# Patient Record
Sex: Female | Born: 1985 | Race: White | Hispanic: No | Smoking: Former smoker
Health system: Southern US, Community
[De-identification: ages and names within clinical notes are randomized; demographics above are authoritative.]

## PROBLEM LIST (undated history)

## (undated) DIAGNOSIS — N644 Mastodynia: Secondary | ICD-10-CM

## (undated) DIAGNOSIS — F99 Mental disorder, not otherwise specified: Secondary | ICD-10-CM

## (undated) DIAGNOSIS — G709 Myoneural disorder, unspecified: Secondary | ICD-10-CM

## (undated) DIAGNOSIS — N6310 Unspecified lump in the right breast, unspecified quadrant: Secondary | ICD-10-CM

## (undated) DIAGNOSIS — L739 Follicular disorder, unspecified: Secondary | ICD-10-CM

## (undated) DIAGNOSIS — B029 Zoster without complications: Secondary | ICD-10-CM

## (undated) DIAGNOSIS — G8929 Other chronic pain: Secondary | ICD-10-CM

## (undated) DIAGNOSIS — B009 Herpesviral infection, unspecified: Secondary | ICD-10-CM

## (undated) DIAGNOSIS — M549 Dorsalgia, unspecified: Secondary | ICD-10-CM

## (undated) DIAGNOSIS — M1990 Unspecified osteoarthritis, unspecified site: Secondary | ICD-10-CM

## (undated) DIAGNOSIS — R102 Pelvic and perineal pain: Secondary | ICD-10-CM

## (undated) DIAGNOSIS — M797 Fibromyalgia: Secondary | ICD-10-CM

## (undated) DIAGNOSIS — F419 Anxiety disorder, unspecified: Secondary | ICD-10-CM

## (undated) HISTORY — DX: Fibromyalgia: M79.7

## (undated) HISTORY — DX: Mental disorder, not otherwise specified: F99

## (undated) HISTORY — DX: Unspecified lump in the right breast, unspecified quadrant: N63.10

## (undated) HISTORY — PX: APPENDECTOMY: SHX54

## (undated) HISTORY — DX: Mastodynia: N64.4

## (undated) HISTORY — PX: WISDOM TOOTH EXTRACTION: SHX21

## (undated) HISTORY — DX: Myoneural disorder, unspecified: G70.9

## (undated) HISTORY — DX: Herpesviral infection, unspecified: B00.9

## (undated) HISTORY — DX: Zoster without complications: B02.9

## (undated) HISTORY — DX: Follicular disorder, unspecified: L73.9

## (undated) HISTORY — DX: Unspecified osteoarthritis, unspecified site: M19.90

## (undated) HISTORY — DX: Anxiety disorder, unspecified: F41.9

---
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
## 2006-08-01 ENCOUNTER — Emergency Department (HOSPITAL_COMMUNITY): Admission: EM | Admit: 2006-08-01 | Discharge: 2006-08-02 | Payer: Self-pay | Admitting: Emergency Medicine

## 2007-02-20 ENCOUNTER — Emergency Department (HOSPITAL_COMMUNITY): Admission: EM | Admit: 2007-02-20 | Discharge: 2007-02-20 | Payer: Self-pay | Admitting: Emergency Medicine

## 2007-03-12 ENCOUNTER — Emergency Department (HOSPITAL_COMMUNITY): Admission: EM | Admit: 2007-03-12 | Discharge: 2007-03-12 | Payer: Self-pay | Admitting: *Deleted

## 2008-02-05 ENCOUNTER — Ambulatory Visit: Payer: Self-pay | Admitting: Gynecology

## 2008-02-05 ENCOUNTER — Observation Stay (HOSPITAL_COMMUNITY): Admission: AD | Admit: 2008-02-05 | Discharge: 2008-02-06 | Payer: Self-pay | Admitting: Obstetrics & Gynecology

## 2008-02-07 ENCOUNTER — Inpatient Hospital Stay (HOSPITAL_COMMUNITY): Admission: AD | Admit: 2008-02-07 | Discharge: 2008-02-07 | Payer: Self-pay | Admitting: Gynecology

## 2008-02-09 ENCOUNTER — Emergency Department (HOSPITAL_COMMUNITY): Admission: EM | Admit: 2008-02-09 | Discharge: 2008-02-10 | Payer: Self-pay | Admitting: Emergency Medicine

## 2008-02-09 ENCOUNTER — Inpatient Hospital Stay (HOSPITAL_COMMUNITY): Admission: AD | Admit: 2008-02-09 | Discharge: 2008-02-09 | Payer: Self-pay | Admitting: Gynecology

## 2010-05-26 ENCOUNTER — Other Ambulatory Visit: Admission: RE | Admit: 2010-05-26 | Discharge: 2010-05-26 | Payer: Self-pay | Admitting: Obstetrics & Gynecology

## 2010-07-03 ENCOUNTER — Ambulatory Visit: Payer: Self-pay | Admitting: Physician Assistant

## 2010-07-03 ENCOUNTER — Inpatient Hospital Stay (HOSPITAL_COMMUNITY): Admission: AD | Admit: 2010-07-03 | Discharge: 2010-07-04 | Payer: Self-pay | Admitting: Obstetrics & Gynecology

## 2010-09-22 ENCOUNTER — Encounter (HOSPITAL_COMMUNITY)
Admission: RE | Admit: 2010-09-22 | Discharge: 2010-10-22 | Payer: Self-pay | Source: Home / Self Care | Admitting: Obstetrics and Gynecology

## 2010-10-22 ENCOUNTER — Encounter (HOSPITAL_COMMUNITY)
Admission: RE | Admit: 2010-10-22 | Discharge: 2010-11-21 | Payer: Self-pay | Source: Home / Self Care | Attending: Obstetrics and Gynecology | Admitting: Obstetrics and Gynecology

## 2010-11-23 ENCOUNTER — Inpatient Hospital Stay (HOSPITAL_COMMUNITY)
Admission: AD | Admit: 2010-11-23 | Discharge: 2010-11-23 | Payer: Self-pay | Source: Home / Self Care | Attending: Obstetrics & Gynecology | Admitting: Obstetrics & Gynecology

## 2010-12-13 ENCOUNTER — Encounter: Payer: Self-pay | Admitting: Obstetrics and Gynecology

## 2010-12-13 ENCOUNTER — Encounter: Payer: Self-pay | Admitting: Gynecology

## 2011-01-05 ENCOUNTER — Inpatient Hospital Stay (HOSPITAL_COMMUNITY)
Admission: AD | Admit: 2011-01-05 | Discharge: 2011-01-10 | DRG: 765 | Disposition: A | Discharge status: Home / Self Care | Payer: Medicaid Other | Source: Ambulatory Visit | Attending: Obstetrics & Gynecology | Admitting: Obstetrics & Gynecology

## 2011-01-05 DIAGNOSIS — O139 Gestational [pregnancy-induced] hypertension without significant proteinuria, unspecified trimester: Secondary | ICD-10-CM | POA: Diagnosis present

## 2011-01-05 DIAGNOSIS — O480 Post-term pregnancy: Principal | ICD-10-CM | POA: Diagnosis present

## 2011-01-06 LAB — COMPREHENSIVE METABOLIC PANEL
AST: 25 U/L (ref 0–37)
Albumin: 3.2 g/dL — ABNORMAL LOW (ref 3.5–5.2)
CO2: 21 mEq/L (ref 19–32)
Calcium: 9.1 mg/dL (ref 8.4–10.5)
Creatinine, Ser: 0.59 mg/dL (ref 0.4–1.2)
GFR calc Af Amer: >60 mL/min (ref >60)
GFR calc non Af Amer: >60 mL/min (ref >60)

## 2011-01-06 LAB — CBC
MCH: 32.4 pg (ref 26.0–34.0)
MCHC: 34.1 g/dL (ref 30.0–36.0)
MCV: 94.4 fL (ref 78.0–100.0)
Platelets: 199 10*3/uL (ref 150–400)
Platelets: 216 10*3/uL (ref 150–400)
RDW: 13.7 % (ref 11.5–15.5)
WBC: 13.4 10*3/uL — ABNORMAL HIGH (ref 4.0–10.5)

## 2011-01-06 LAB — PROTEIN / CREATININE RATIO, URINE: Creatinine, Urine: 58 mg/dL

## 2011-01-06 LAB — RPR: RPR Ser Ql: NONREACTIVE

## 2011-01-07 DIAGNOSIS — O139 Gestational [pregnancy-induced] hypertension without significant proteinuria, unspecified trimester: Secondary | ICD-10-CM

## 2011-01-07 DIAGNOSIS — O480 Post-term pregnancy: Secondary | ICD-10-CM

## 2011-01-08 LAB — CBC
Hemoglobin: 10.1 g/dL — ABNORMAL LOW (ref 12.0–15.0)
MCHC: 33.2 g/dL (ref 30.0–36.0)
Platelets: 174 10*3/uL (ref 150–400)
RDW: 13.7 % (ref 11.5–15.5)

## 2011-01-11 LAB — RH IMMUNE GLOB WKUP(>/=20WKS)(NOT WOMEN'S HOSP)
Fetal Screen: NEGATIVE
Unit division: 0

## 2011-01-11 NOTE — Op Note (Signed)
Debbie [name], Debbie [name]            ACCOUNT NO.:  [id number]  MEDICAL RECORD NO.:  [id number]           PATIENT TYPE:  I  LOCATION:  9103                          FACILITY:  [facility]  PHYSICIAN:  Maryelizabeth Kaufmann, MD  DATE OF BIRTH:  [birth year]  DATE OF PROCEDURE:  01/07/2011 DATE OF DISCHARGE:                              OPERATIVE REPORT   PREOPERATIVE DIAGNOSES: 1. Intrauterine pregnancy at 41 weeks and 1 day. 2. Arrest of dilation. 3. Failed induction of labor.  POSTOPERATIVE DIAGNOSES: 1. Intrauterine pregnancy at 41 weeks and 1 day. 2. Arrest of dilation. 3. Failed induction of labor.  PROCEDURE:  Primary low transverse cesarean section via Pfannenstiel incision.  SURGEONS:  Catalina Antigua, MD and Maryelizabeth Kaufmann, MD.  ANESTHESIA:  Epidural.  INTRAVENOUS FLUIDS:  3 liters.  URINE OUTPUT:  200 mL of clear urine at the end of procedure.  ESTIMATED BLOOD LOSS:  800 mL.  FINDINGS:  A viable female infant in vertex presentation with clear amniotic fluid.  Normal uterus, tubes, and ovaries.  Apgars were 8 and 9, weight was 9 pounds 7 ounces.  SPECIMENS:  Placenta was sent to Labor and Delivery.  COMPLICATIONS:  None immediate.  INDICATIONS:  This is a 25 year old gravida 1, who presented with an intrauterine pregnancy at 41 weeks with gestational hypertension with blood pressures 140s-150s systolic and postdates.  Decision was made at that time to admit for induction of labor.  The patient received 2 doses Cytotec.  She also received a Foley bulb.  Subsequently after the Foley bulb was released, she progressed of 5 cm.  The patient was then started on Pitocin.  The patient made slow change and she remained at 6-7 cm for greater than 5 hours despite an IUPC and augmentation with Pitocin. Decision was made at that time to proceed with a cesarean section. Risks, benefits, and alternatives were explained to the patient, including but not limited to, the risk of bleeding,  infection, and damage to adjacent organs.  The patient was consented and proceeded with a primary low transverse cesarean section due to failure to progress and arrest of dilatation.  PROCEDURE NOTE IN DETAIL:  After the patient was taken back to the operative suite, epidural anesthesia was then redosed and found to be adequate.  The patient was placed in dorsal supine position with a leftward tilt.  The patient was prepped and draped in normal sterile fashion.  Again, after anesthesia was found to be adequate, a skin incision was then made via Pfannenstiel with a scalpel.  Then, incision was then carried down through to underlying fascia with the Bovie and the fascia was then incised in the midline.  The fascial incision was then extended laterally with the Mayo scissors.  The superior aspect of the fascial incision was then grasped with a Kocher clamps, elevated, tented up, and the rectus muscles were then dissected off bluntly. Attention was then turned to the inferior aspect which in similar fashion was grasped with the Kocher clamps, elevated, tented up, and the rectus muscles were then dissected off bluntly.  The rectus muscles were then separated in the midline.  The peritoneum  was then entered bluntly and Alexis 0 ring retractor was then inserted.  The vesicouterine peritoneum was then identified.  It was entered sharply with the Metzenbaum scissors and the bladder flap was then created digitally. The lower uterine segment was then incised in a transverse fashion with the scalpel.  The incision was then extended manually.  The infant was found to be in vertex presentation.  Infant was delivered otherwise atraumatically.  Mouth and nose were bulb suctioned.  Cord was cut and clamped.  It was noted that the patient did have a nuchal body cord and the cord was cut and clamped.  Infant was handed off to awaiting NICU. Apgars were 8/9, weight was 9 pounds and 7 ounces.  Cord blood  was sampled.  Placenta was then delivered spontaneously intact with 3-vessel cord.  The uterus was then cleared of all clots and debris.  The uterus did have some mild atony, which resolved with massage and Pitocin.  The uterine incision was then repaired with 0 Vicryl in a running locking fashion and then subsequent second layer was then imbricated using a 2- layer closure.  The uterine incision was then found to be hemostatic. However, she did have one area that required 1 figure-of-eight. Otherwise, the peritoneum was then irrigated clear of all clots and debris.  The Alexis 0 ring retractor was then removed.  The uterine incision was then visualized and found to be hemostatic.  The peritoneum was then cleared of all clots and debris and irrigated.  The fascia was then repaired with 0 Vicryl in a running fashion.  The subcutaneous tissue was then irrigated.  Additional hemostasis was obtained with a Bovie.  The skin was then closed with a 4-0 Vicryl in a subcuticular fashion.  The patient tolerated the procedure well.  The patient was taken back to the recovery area in stable condition.  Lap, needle, and sponge counts were correct x2.  The patient did receive antibiotics perioperatively.          ______________________________ Maryelizabeth Kaufmann, MD     LC/MEDQ  D:  01/07/2011  T:  01/08/2011  Job:  [id number]  Electronically Signed by Maryelizabeth Kaufmann MD on 01/11/2011 12:25:35 PM

## 2011-01-19 NOTE — Discharge Summary (Signed)
Debbie [name], Debbie [name]            ACCOUNT NO.:  [id number]  MEDICAL RECORD NO.:  [id number]           PATIENT TYPE:  I  LOCATION:  9103                          [facility]ILITY:  [facility]  PHYSICIAN:  Horton Chin, MD DATE OF BIRTH:  1986-07-21  DATE OF ADMISSION:  01/05/2011 DATE OF DISCHARGE:  01/10/2011                              DISCHARGE SUMMARY   ADMITTING DIAGNOSES: 1. Intrauterine pregnancy at 65 and 0/7th weeks' gestation. 2. Gestational hypertension. 3. Unfavorable cervix.  DISCHARGE DIAGNOSES: 1. Intrauterine pregnancy at 36 and 0/7th weeks' gestation. 2. Gestational hypertension. 3. Unfavorable cervix. 4. Failure to progress.  HOSPITAL COURSE:  Debbie [name] is a 25 year old, gravida 2, para 0-0-1-0 who was admitted at 57 and 0/7th weeks with contractions.  She was having some slightly elevated blood pressures, 149/86.  Her cervix was dilated initially to 1 cm and changed to 2 cm after observation.  She had PIH labs done which were within normal limits and she was admitted to Labor and Delivery for induction of labor due to the elevated blood pressures.  Her pregnancy has been followed by the [facility] and has been remarkable for: 1. Rh negative. 2. Back pain during pregnancy requiring Lortab. 3. Group B strep negative.  The patient reached 6-7 cm and 80% effaced after multiple methods of labor induction were used including Cytotec, Foley, and Pitocin. Cesarean section was discussed with the patient due to no cervical change over 5 hours with adequate labor.  The patient agreed with that plan.  She was taken to the operating room.  Infant was a viable female in vertex presentation, weight was 9 pounds and 7 ounces, Apgars were 8 and 9.  Infant was taken to the Full-Term Nursery in good condition.  The patient was taken to the recovery room and was doing well by postop day #1.  The patient was doing well.  Her hemoglobin was 10.1, hematocrit 30.4,  white blood cell count 16.5, and platelets 174,000.  Predelivery, her hemoglobin was 12.2, hemoglobin 35.8, white blood cell count 12.0, and platelets 199,000.  The patient was initially breastfeeding and then changed to bottle feeding.  She expressed a desire for oral contraceptives.  On postop day #2, she received some Lasix as well as started on HCTZ due to some severe pitting edema.  By postop day #3, she was doing well.  Vital signs were stable.  She was deemed to have received full benefit of her hospital stay and she was discharged home.  DISCHARGE MEDICATIONS: 1. Motrin 600 mg 1 p.o. q.6 h. p.r.n. pain. 2. Percocet 5/325 one to two p.o. q.4-6 h. p.r.n. pain. 3. Loestrin 1 p.o. daily to start on January 31, 2011. 4. HCTZ 25 mg 1 p.o. daily x7 days.  DISCHARGE INSTRUCTIONS:  Per postpartum handout.  Discharge followup will occur at [facility] in 4-6 weeks or as needed.     Cam Hai, C.N.M.   ______________________________ Horton Chin, MD    KS/MEDQ  D:  01/10/2011  T:  01/10/2011  Job:  [id number]  Electronically Signed by Cam Hai C.N.M. on 01/16/2011 08:33:19 AM Electronically Signed by  Jaynie Collins MD on 01/19/2011 06:33:34 PM

## 2011-02-01 LAB — COMPREHENSIVE METABOLIC PANEL
ALT: 14 U/L (ref 0–35)
Alkaline Phosphatase: 99 U/L (ref 39–117)
BUN: 7 mg/dL (ref 6–23)
Chloride: 106 mEq/L (ref 96–112)
Glucose, Bld: 88 mg/dL (ref 70–99)
Potassium: 3.7 mEq/L (ref 3.5–5.1)
Sodium: 134 mEq/L — ABNORMAL LOW (ref 135–145)
Total Bilirubin: 0.7 mg/dL (ref 0.3–1.2)

## 2011-02-01 LAB — URINALYSIS, ROUTINE W REFLEX MICROSCOPIC
Glucose, UA: NEGATIVE mg/dL
Hgb urine dipstick: NEGATIVE
Specific Gravity, Urine: >1.03 — ABNORMAL HIGH (ref 1.005–1.030)
pH: 6 (ref 5.0–8.0)

## 2011-02-01 LAB — CBC
HCT: 37.3 % (ref 36.0–46.0)
Hemoglobin: 12.7 g/dL (ref 12.0–15.0)
MCV: 94.7 fL (ref 78.0–100.0)
RBC: 3.94 MIL/uL (ref 3.87–5.11)
WBC: 15.4 10*3/uL — ABNORMAL HIGH (ref 4.0–10.5)

## 2011-02-05 LAB — URINALYSIS, ROUTINE W REFLEX MICROSCOPIC
Hgb urine dipstick: NEGATIVE
Ketones, ur: NEGATIVE mg/dL
Protein, ur: NEGATIVE mg/dL
Urobilinogen, UA: 0.2 mg/dL (ref 0.0–1.0)

## 2011-03-18 ENCOUNTER — Emergency Department (HOSPITAL_COMMUNITY): Payer: Medicaid Other

## 2011-03-18 ENCOUNTER — Emergency Department (HOSPITAL_COMMUNITY)
Admission: EM | Admit: 2011-03-18 | Discharge: 2011-03-19 | Disposition: A | Discharge status: Home / Self Care | Payer: Medicaid Other | Attending: Emergency Medicine | Admitting: Emergency Medicine

## 2011-03-18 DIAGNOSIS — Z79899 Other long term (current) drug therapy: Secondary | ICD-10-CM | POA: Insufficient documentation

## 2011-03-18 DIAGNOSIS — M549 Dorsalgia, unspecified: Secondary | ICD-10-CM | POA: Insufficient documentation

## 2011-03-18 DIAGNOSIS — G43909 Migraine, unspecified, not intractable, without status migrainosus: Secondary | ICD-10-CM | POA: Insufficient documentation

## 2011-03-18 DIAGNOSIS — F329 Major depressive disorder, single episode, unspecified: Secondary | ICD-10-CM | POA: Insufficient documentation

## 2011-03-18 DIAGNOSIS — F3289 Other specified depressive episodes: Secondary | ICD-10-CM | POA: Insufficient documentation

## 2011-04-06 NOTE — Discharge Summary (Signed)
Debbie [name], BOSS NO.:  [id number]   MEDICAL RECORD NO.:  [id number]          PATIENT TYPE:  OBV   LOCATION:  9309                          [facility]:  [facility]   PHYSICIAN:  Ginger Carne, MD  DATE OF BIRTH:  [birth year]   DATE OF ADMISSION:  02/05/2008  DATE OF DISCHARGE:  02/06/2008                               DISCHARGE SUMMARY   REASON FOR HOSPITALIZATION:  Positive serum pregnancy test with right  lower quadrant pain.   IN-HOSPITAL PROCEDURES:  Pelvic sonogram.   FINAL DIAGNOSIS:  Early pregnancy.   HISTORY OF PRESENT ILLNESS:  This patient is a 25 year old gravida 1,  para 0, Caucasian female who presented to the maternity admission unit  because of right lower quadrant pain and a positive pregnancy test.  The  patient was noted to have a quantitative HCG of 257 at 9 p.m. on February 05, 2008.  Her quantitative HCG on the morning of February 06, 2008, was  269.  During the course of the evening, the patient states that her  right lower quadrant pain has significantly diminished.  She denies  spotting.  Her CBC on the morning of the February 06, 2008, was 34.7 and  12.3 hematocrit and hemoglobin respectively.  White cell count was 8.0.   Review of sonogram with Dr. Eppie Gibson demonstrate no evidence of free fluid,  no evidence of failed intrauterine pregnancy or early gestational sac.  Her right and left tubes appeared normal as well as both adnexa.  There  was no adnexal enlargement on either side.  There was a questionable  corpus luteal cyst on the right side, but this was not confirmatory.   At this point, the patient feels well to go home, and the decision was  made that since it has only been less than 12 hour time difference  between her quantitative HCG obtained on the evening of her admission  and in the morning of her said discharge, the patient will be return to  the maternity admission unit on February 07, 2008, in order to have a  repeat quantitative  HCG and reassessment.  At this point, it is unclear  whether the patient has an early viable intrauterine pregnancy, a failed  intrauterine gestation, or an ectopic pregnancy.  The patient was  apprised of these findings and was asked to return to the maternity  admission unit immediately for increasing right lower quadrant pain,  dizziness, or abdominal pain, or vaginal bleeding.  She is comfortable  with this approach.  She was offered to stay in the hospital overnight  and have followup testing tomorrow but was in agreement that she could  be discharged safely and to return as needed.  The patient understood  all instructions and verbalized understanding the same.      Ginger Carne, MD  Electronically Signed     SHB/MEDQ  D:  02/06/2008  T:  02/06/2008  Job:  [id number]

## 2011-05-27 ENCOUNTER — Other Ambulatory Visit: Payer: Self-pay | Admitting: Adult Health

## 2011-05-27 ENCOUNTER — Other Ambulatory Visit (HOSPITAL_COMMUNITY)
Admission: RE | Admit: 2011-05-27 | Discharge: 2011-05-27 | Disposition: A | Discharge status: Home / Self Care | Payer: Medicaid Other | Source: Ambulatory Visit | Attending: Obstetrics and Gynecology | Admitting: Obstetrics and Gynecology

## 2011-05-27 DIAGNOSIS — Z113 Encounter for screening for infections with a predominantly sexual mode of transmission: Secondary | ICD-10-CM | POA: Insufficient documentation

## 2011-05-27 DIAGNOSIS — Z01419 Encounter for gynecological examination (general) (routine) without abnormal findings: Secondary | ICD-10-CM | POA: Insufficient documentation

## 2011-05-30 ENCOUNTER — Emergency Department (HOSPITAL_COMMUNITY)
Admission: EM | Admit: 2011-05-30 | Discharge: 2011-05-30 | Disposition: A | Discharge status: Home / Self Care | Payer: Medicaid Other | Attending: Emergency Medicine | Admitting: Emergency Medicine

## 2011-05-30 ENCOUNTER — Encounter: Payer: Self-pay | Admitting: Emergency Medicine

## 2011-05-30 DIAGNOSIS — K0889 Other specified disorders of teeth and supporting structures: Secondary | ICD-10-CM

## 2011-05-30 DIAGNOSIS — K089 Disorder of teeth and supporting structures, unspecified: Secondary | ICD-10-CM | POA: Insufficient documentation

## 2011-05-30 MED ORDER — OXYCODONE-ACETAMINOPHEN 5-325 MG PO TABS
1.0000 | ORAL_TABLET | Freq: Once | ORAL | Status: AC
  Administered 2011-05-30: 1 via ORAL
  Filled 2011-05-30: qty 1

## 2011-05-30 MED ORDER — OXYCODONE-ACETAMINOPHEN 5-325 MG PO TABS: 1.0000 | ORAL_TABLET | ORAL | Status: AC | PRN

## 2011-05-30 MED ORDER — PENICILLIN V POTASSIUM 500 MG PO TABS: 500.0000 mg | ORAL_TABLET | Freq: Four times a day (QID) | ORAL | Status: AC

## 2011-05-30 NOTE — ED Notes (Signed)
No change. Awaiting pa

## 2011-05-30 NOTE — ED Notes (Signed)
Pa with pt at this time.

## 2011-05-30 NOTE — ED Provider Notes (Signed)
History     Chief Complaint  Patient presents with  . Dental Pain   Patient is a 25 y.o. female presenting with tooth pain. The history is provided by the patient.  Dental PainThe primary symptoms include mouth pain. Primary symptoms do not include dental injury, oral bleeding, headaches, fever or sore throat. The symptoms began 3 to 5 days ago. The symptoms are worsening. The symptoms are new. The symptoms occur constantly.  Additional symptoms include: gum swelling and gum tenderness. Additional symptoms do not include: trismus, jaw pain, facial swelling, pain with swallowing and ear pain.    History reviewed. No pertinent past medical history.  Past Surgical History  Procedure Date  . Cesarean section     Family History  Problem Relation Age of Onset  . Diabetes Father   . Hypertension Father   . Hypertension Sister     History  Substance Use Topics  . Smoking status: Passive Smoker  . Smokeless tobacco: Not on file  . Alcohol Use: No    OB History    Grav Para Term Preterm Abortions TAB SAB Ect Mult Living   2 1 1  1   1  1       Review of Systems  Constitutional: Negative.  Negative for fever.  HENT: Positive for dental problem. Negative for ear pain, sore throat, facial swelling and sneezing.   Respiratory: Negative.   Cardiovascular: Negative.   Musculoskeletal: Negative.   Neurological: Negative for weakness and headaches.    Physical Exam  BP 144/70  Pulse 90  Temp(Src) 98.1 F (36.7 C) (Oral)  Resp 16  Ht 5\' 2"  (1.575 m)  Wt 187 lb (84.823 kg)  BMI 34.20 kg/m2  SpO2 100%  LMP 04/28/2011  Physical Exam  Constitutional: She is oriented to person, place, and time. She appears well-developed and well-nourished.  Non-toxic appearance.  HENT:  Head: Normocephalic.  Right Ear: External ear normal.  Left Ear: External ear normal.  Mouth/Throat: Uvula is midline. No dental abscesses or uvula swelling.         Right lower gum is slightly swollen and  erythematous  Eyes: EOM are normal. Pupils are equal, round, and reactive to light.  Neck: Normal range of motion. Neck supple.  Cardiovascular: Normal rate, regular rhythm and normal heart sounds.   Pulmonary/Chest: Effort normal and breath sounds normal.  Musculoskeletal: Normal range of motion.  Lymphadenopathy:    She has no cervical adenopathy.  Neurological: She is alert and oriented to person, place, and time.  Skin: Skin is warm and dry.    ED Course  Procedures  MDM  PAtient is alert, NAD.  No facial swelling, airway is clear      Debbie [name] L. [place], [place] 05/30/11 1656

## 2011-05-30 NOTE — ED Notes (Signed)
Pt c/o r lower toothache since wednesday

## 2011-06-17 ENCOUNTER — Other Ambulatory Visit: Payer: Self-pay | Admitting: Obstetrics and Gynecology

## 2011-08-12 ENCOUNTER — Emergency Department (HOSPITAL_COMMUNITY): Payer: Medicaid Other

## 2011-08-12 ENCOUNTER — Encounter (HOSPITAL_COMMUNITY): Payer: Self-pay | Admitting: *Deleted

## 2011-08-12 ENCOUNTER — Emergency Department (HOSPITAL_COMMUNITY)
Admission: EM | Admit: 2011-08-12 | Discharge: 2011-08-12 | Disposition: A | Discharge status: Home / Self Care | Payer: Medicaid Other | Attending: Emergency Medicine | Admitting: Emergency Medicine

## 2011-08-12 DIAGNOSIS — E119 Type 2 diabetes mellitus without complications: Secondary | ICD-10-CM | POA: Insufficient documentation

## 2011-08-12 DIAGNOSIS — S335XXA Sprain of ligaments of lumbar spine, initial encounter: Secondary | ICD-10-CM | POA: Insufficient documentation

## 2011-08-12 DIAGNOSIS — I10 Essential (primary) hypertension: Secondary | ICD-10-CM | POA: Insufficient documentation

## 2011-08-12 DIAGNOSIS — X500XXA Overexertion from strenuous movement or load, initial encounter: Secondary | ICD-10-CM | POA: Insufficient documentation

## 2011-08-12 DIAGNOSIS — M545 Low back pain, unspecified: Secondary | ICD-10-CM | POA: Insufficient documentation

## 2011-08-12 DIAGNOSIS — Y92009 Unspecified place in unspecified non-institutional (private) residence as the place of occurrence of the external cause: Secondary | ICD-10-CM | POA: Insufficient documentation

## 2011-08-12 HISTORY — DX: Dorsalgia, unspecified: M54.9

## 2011-08-12 LAB — POCT PREGNANCY, URINE: Preg Test, Ur: NEGATIVE

## 2011-08-12 MED ORDER — OXYCODONE-ACETAMINOPHEN 5-325 MG PO TABS: 1.0000 | ORAL_TABLET | ORAL | Status: AC | PRN

## 2011-08-12 MED ORDER — ONDANSETRON 8 MG PO TBDP
8.0000 mg | ORAL_TABLET | Freq: Once | ORAL | Status: AC
  Administered 2011-08-12: 8 mg via ORAL
  Filled 2011-08-12: qty 1

## 2011-08-12 MED ORDER — IBUPROFEN 800 MG PO TABS: 800.0000 mg | ORAL_TABLET | Freq: Three times a day (TID) | ORAL | Status: AC

## 2011-08-12 MED ORDER — HYDROMORPHONE HCL 1 MG/ML IJ SOLN
1.0000 mg | Freq: Once | INTRAMUSCULAR | Status: AC
  Administered 2011-08-12: 1 mg via INTRAMUSCULAR
  Filled 2011-08-12: qty 1

## 2011-08-12 NOTE — ED Notes (Signed)
Pt states bent down to pick up her son this morning and felt pain in lower back and fell backwards.  Now c/o pain to lower back, shooting down right leg.  Denies hitting head when falling.  Pt tearful in triage.

## 2011-08-12 NOTE — ED Provider Notes (Signed)
History     CSN: [id number] Arrival date & time: 08/12/2011  2:35 PM  Chief Complaint  Patient presents with  . Back Pain    HPI  (Consider location/radiation/quality/duration/timing/severity/associated sxs/prior treatment)  Patient is a 25 y.o. female presenting with back pain and fall.  Back Pain  Pertinent negatives include no chest pain, no fever, no numbness, no headaches, no abdominal pain, no tingling and no weakness.  Fall The accident occurred 6 to 12 hours ago. The fall occurred while standing (she stooped over to pick up her infant son,  when she stumbbled backward,  falling on her lower back and right hip area.). She landed on a hard floor. The point of impact was the right hip (lower back). The pain is at a severity of 10/10. The pain is severe. She was ambulatory at the scene. Pertinent negatives include no fever, no numbness, no abdominal pain, no nausea, no headaches and no tingling. Associated symptoms comments: Pain radiates to her right anterolateral lower thigh.. The symptoms are aggravated by activity (Movement). She has tried rest for the symptoms. The treatment provided no relief.    Past Medical History  Diagnosis Date  . Back pain     Past Surgical History  Procedure Date  . Cesarean section     Family History  Problem Relation Age of Onset  . Diabetes Father   . Hypertension Father   . Hypertension Sister     History  Substance Use Topics  . Smoking status: Passive Smoker  . Smokeless tobacco: Not on file  . Alcohol Use: No    OB History    Grav Para Term Preterm Abortions TAB SAB Ect Mult Living   2 1 1  1   1  1       Review of Systems  Review of Systems  Constitutional: Negative for fever.  HENT: Negative for congestion, sore throat and neck pain.   Eyes: Negative.   Respiratory: Negative for chest tightness and shortness of breath.   Cardiovascular: Negative for chest pain.  Gastrointestinal: Negative for nausea and abdominal pain.   Genitourinary: Negative.   Musculoskeletal: Positive for back pain. Negative for joint swelling and arthralgias.  Skin: Negative.  Negative for rash and wound.  Neurological: Negative for dizziness, tingling, weakness, light-headedness, numbness and headaches.  Hematological: Negative.   Psychiatric/Behavioral: Negative.     Allergies  Review of patient's allergies indicates no known allergies.  Home Medications   Current Outpatient Rx  Name Route Sig Dispense Refill  . ACETAMINOPHEN 500 MG PO TABS Oral Take 500-1,000 mg by mouth daily as needed. For pain     . IBUPROFEN 200 MG PO TABS Oral Take 200-600 mg by mouth daily as needed. For back pain     . LOESTRIN 24 FE PO Oral Take 1 tablet by mouth daily.      Marland Kitchen HYDROCODONE-ACETAMINOPHEN 5-325 MG PO TABS Oral Take 1 tablet by mouth every 6 (six) hours as needed.      . IBUPROFEN 800 MG PO TABS Oral Take 1 tablet (800 mg total) by mouth 3 (three) times daily. 15 tablet 0  . OXYCODONE-ACETAMINOPHEN 5-325 MG PO TABS Oral Take 1 tablet by mouth every 4 (four) hours as needed for pain. 20 tablet 0    Physical Exam    BP 134/93  Pulse 115  Temp(Src) 99.4 F (37.4 C) (Oral)  Resp 16  Ht 5\' 2"  (1.575 m)  Wt 190 lb (86.183 kg)  BMI 34.75 kg/m2  SpO2 100%  LMP 06/21/2011  Physical Exam  Constitutional: She is oriented to person, place, and time. She appears well-developed and well-nourished.  HENT:  Head: Normocephalic.  Eyes: Conjunctivae are normal.  Neck: Normal range of motion. Neck supple.  Cardiovascular: Regular rhythm and intact distal pulses.        Pedal pulses normal.  Pulmonary/Chest: Effort normal. She has no wheezes.  Abdominal: Soft. Bowel sounds are normal. She exhibits no distension and no mass.  Musculoskeletal: Normal range of motion. She exhibits no edema.       Lumbar back: She exhibits tenderness. She exhibits no swelling, no edema and no spasm.  Neurological: She is alert and oriented to person, place, and  time. She has normal strength. She displays no atrophy and no tremor. No cranial nerve deficit or sensory deficit. Gait normal.  Reflex Scores:      Patellar reflexes are 2+ on the right side and 2+ on the left side.      Achilles reflexes are 2+ on the right side and 2+ on the left side.      No strength deficit noted in hip and knee flexor and extensor muscle groups.  Ankle flexion and extension intact.  Skin: Skin is warm and dry.  Psychiatric: She has a normal mood and affect.    ED Course  Procedures (including critical care time)   Labs Reviewed  POCT PREGNANCY, URINE  POCT PREGNANCY, URINE   Dg Lumbar Spine Complete  08/12/2011  *RADIOLOGY REPORT*  Clinical Data: 25 year old female with fall and low back pain.  LUMBAR SPINE - COMPLETE 4+ VIEW  Comparison: 04/26 1012  Findings: Five non-rib bearing lumbar type vertebra are noted in normal alignment. There is no evidence of fracture or subluxation. The disc spaces are maintained. No focal bony lesions or spondylolysis noted.  IMPRESSION: Unremarkable lumbar spine series.  Original Report Authenticated By: Rosendo Gros, M.D.   Dg Hip Bilateral W/pelvis  08/12/2011  *RADIOLOGY REPORT*  Clinical Data: 25 year old female - fall and bilateral hip pain.  BILATERAL HIP WITH PELVIS - 4+ VIEW  Comparison: None  Findings: No evidence of acute fracture, subluxation or dislocation identified.  No radio-opaque foreign bodies are present.  No focal bony lesions are noted.  The joint spaces are unremarkable.  IMPRESSION: Unremarkable hips bilaterally.  Original Report Authenticated By: Rosendo Gros, M.D.     1. Lumbar back pain      MDM Lumbar strain/ contusion.        Candis Musa, PA 08/12/11 1735

## 2011-08-12 NOTE — ED Provider Notes (Signed)
Medical screening examination/treatment/procedure(s) were performed by non-physician practitioner and as supervising physician I was immediately available for consultation/collaboration.  Shelda Jakes, MD 08/12/11 [phone]

## 2011-08-14 NOTE — ED Provider Notes (Signed)
Medical screening examination/treatment/procedure(s) were performed by non-physician practitioner and as supervising physician I was immediately available for consultation/collaboration.   Shelda Jakes, MD 08/14/11 [phone]

## 2011-08-16 LAB — CBC
HCT: 39
HCT: 42.9
Hemoglobin: 13.6
Hemoglobin: 15.2 — ABNORMAL HIGH
MCHC: 35.3
MCV: 97.3
MCV: 97.6
MCV: 98
Platelets: 252
RBC: 4.42
RDW: 12.8
WBC: 10.9 — ABNORMAL HIGH

## 2011-08-16 LAB — COMPREHENSIVE METABOLIC PANEL
Albumin: 3.7
BUN: 9
Calcium: 8.9
Creatinine, Ser: 0.57
Total Protein: 6.5

## 2011-08-16 LAB — ABO/RH: Weak D: NEGATIVE

## 2011-08-16 LAB — URINALYSIS, ROUTINE W REFLEX MICROSCOPIC
Bilirubin Urine: NEGATIVE
Hgb urine dipstick: NEGATIVE
Ketones, ur: NEGATIVE
Specific Gravity, Urine: 1.02
Urobilinogen, UA: 0.2

## 2011-08-16 LAB — HCG, QUANTITATIVE, PREGNANCY
hCG, Beta Chain, Quant, S: 1777 — ABNORMAL HIGH
hCG, Beta Chain, Quant, S: 269 — ABNORMAL HIGH

## 2011-08-16 LAB — GC/CHLAMYDIA PROBE AMP, GENITAL: GC Probe Amp, Genital: NEGATIVE

## 2011-08-16 LAB — WET PREP, GENITAL
Clue Cells Wet Prep HPF POC: NONE SEEN
Trich, Wet Prep: NONE SEEN
Yeast Wet Prep HPF POC: NONE SEEN

## 2011-08-16 LAB — POCT PREGNANCY, URINE: Operator id: 23932

## 2011-08-23 ENCOUNTER — Emergency Department (HOSPITAL_COMMUNITY)
Admission: EM | Admit: 2011-08-23 | Discharge: 2011-08-23 | Disposition: A | Discharge status: Home / Self Care | Payer: Medicaid Other | Attending: Emergency Medicine | Admitting: Emergency Medicine

## 2011-08-23 DIAGNOSIS — M545 Low back pain, unspecified: Secondary | ICD-10-CM | POA: Insufficient documentation

## 2011-08-23 DIAGNOSIS — W108XXA Fall (on) (from) other stairs and steps, initial encounter: Secondary | ICD-10-CM | POA: Insufficient documentation

## 2011-08-23 DIAGNOSIS — R000 Tachycardia, unspecified: Secondary | ICD-10-CM | POA: Insufficient documentation

## 2011-08-23 DIAGNOSIS — M79609 Pain in unspecified limb: Secondary | ICD-10-CM | POA: Insufficient documentation

## 2011-09-16 ENCOUNTER — Ambulatory Visit (INDEPENDENT_AMBULATORY_CARE_PROVIDER_SITE_OTHER): Payer: Medicaid Other | Admitting: Family Medicine

## 2011-09-16 VITALS — BP 131/92 | Ht 62.0 in | Wt 188.0 lb

## 2011-09-16 DIAGNOSIS — M5416 Radiculopathy, lumbar region: Secondary | ICD-10-CM

## 2011-09-16 DIAGNOSIS — M545 Low back pain, unspecified: Secondary | ICD-10-CM

## 2011-09-16 DIAGNOSIS — IMO0002 Reserved for concepts with insufficient information to code with codable children: Secondary | ICD-10-CM

## 2011-09-16 MED ORDER — PREDNISONE 5 MG PO KIT: PACK | ORAL | Status: DC

## 2011-09-16 MED ORDER — HYDROCODONE-ACETAMINOPHEN 5-325 MG PO TABS: 1.0000 | ORAL_TABLET | Freq: Four times a day (QID) | ORAL | Status: AC | PRN

## 2011-09-16 NOTE — Patient Instructions (Signed)
Your MRI is schd for 10.31.12 at 8am at [facility]. [phone]

## 2011-09-17 NOTE — Progress Notes (Signed)
Subjective:    Patient ID: Debbie [name], female    DOB: Apr 07, 1986, 25 y.o.   MRN: [id number]  HPI Debbie [name] is a pleasant 25yo female patient complaining of LBP since 05/2010. 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 She was referred to physical therapy in which she did for 7 months with a mild improvement of her symptoms. She also was seen by a chiropractor for 2 month who treated her with a mild improvement of her symptoms. She has been at the urgent care and ER  for at least 3 times for her low back pain where x-rays of her low back and hip have been done which were negative for any acute pathology. She was given pain medications and a intramuscular cortisone injection without improvement of her symptoms. She has been trying home remedies asked ice, heat, stretches, without any symptoms improvement. She stated the pain worsened to month ago, and now the pain is sharp , severe, 6/10 in intensity, radiated to her right leg, constant, also numbness and tingling radiated distally to her right foot. The pain gets better with rest, and worsened with activity. She has tried NSAIDs OTC without any improvement. Denies any urinary or fecal incontinence. Denies fever denies any urinary symptoms. She is on acute distress and crying during the whole visit time to time her pain and frustration of not improvement.  There is no problem list on file for this patient.  Current Outpatient Prescriptions on File Prior to Visit  Medication Sig Dispense Refill  . acetaminophen (TYLENOL) 500 MG tablet Take 500-1,000 mg by mouth daily as needed. For pain       . HYDROcodone-acetaminophen (NORCO) 5-325 MG per tablet Take 1 tablet by mouth every 6 (six) hours as needed.        Marland Kitchen ibuprofen (ADVIL,MOTRIN) 200 MG tablet Take 200-600 mg by mouth daily as needed.  For back pain       . Norethin Ace-Eth Estrad-FE (LOESTRIN 24 FE PO) Take 1 tablet by mouth daily.         No Known Allergies    Review of Systems  Constitutional: Negative for fever, chills, diaphoresis and fatigue.  HENT: Negative for neck pain and neck stiffness.   Cardiovascular: Negative for leg swelling.  Genitourinary: Negative for urgency, vaginal bleeding, vaginal discharge, vaginal pain and pelvic pain.  Musculoskeletal: Positive for back pain. Negative for joint swelling, arthralgias and gait problem.  Neurological: Positive for numbness. Negative for weakness.       Objective:   Physical Exam  Constitutional: She appears well-developed and well-nourished.       BP 131/92  Ht 5\' 2"  (1.575 m)  Wt 188 lb (85.276 kg)  BMI 34.39 kg/m2  Neck: Normal range of motion. Neck supple. No thyromegaly present.  Pulmonary/Chest: Effort normal.  Musculoskeletal:       Low back with intact skin. No swelling, no hematomas. Decreased ROM for flexion at 90 degrees, extension 10 degrees, decreased rotation and lateralization.  Tenderness to palpation lumbar area L4-L5  Mild TTP in R SI joint area . Faber test negative for SI joint pain. Straight leg raise positive at 45 degrees in right leg. Strength 4/5 for hip flexion and extension, 4/5 for knee flexion and extension, 3/5 for ankle plantar and dorsal flexion in right lower extremity.Strength  5/5 for hip flexion and extension, 5/5 for knee flexion and extension, 5/5 for ankle plantar and dorsal flexion in the left lower extremity. DTR patellar and achilles II/IV B/L Sensation intact distally B/L. No leg discrepancy.   Neurological: She is alert.  Skin: Skin is warm. No rash noted. No erythema. No pallor.  Psychiatric: She has a normal mood and affect.          Assessment & Plan:   1. Right lumbar radiculopathy  PredniSONE (STERAPRED 12 DAY) 5 MG KIT, MR Lumbar Spine Wo Contrast, HYDROcodone-acetaminophen (NORCO) 5-325 MG per  tablet  2. Low back pain  PredniSONE (STERAPRED 12 DAY) 5 MG KIT, MR Lumbar Spine Wo Contrast, HYDROcodone-acetaminophen (NORCO) 5-325 MG per tablet   F/U after MRI results.

## 2011-09-23 ENCOUNTER — Ambulatory Visit (HOSPITAL_COMMUNITY)
Admission: RE | Admit: 2011-09-23 | Discharge: 2011-09-23 | Disposition: A | Discharge status: Home / Self Care | Payer: Medicaid Other | Source: Ambulatory Visit | Attending: Family Medicine | Admitting: Family Medicine

## 2011-09-23 DIAGNOSIS — M79609 Pain in unspecified limb: Secondary | ICD-10-CM | POA: Insufficient documentation

## 2011-09-23 DIAGNOSIS — M545 Low back pain, unspecified: Secondary | ICD-10-CM | POA: Insufficient documentation

## 2011-09-27 ENCOUNTER — Telehealth: Payer: Self-pay | Admitting: *Deleted

## 2011-09-27 NOTE — Telephone Encounter (Signed)
Pt called requesting MRI results.  Will forward to Dr. Ashley Jacobs.

## 2011-09-28 ENCOUNTER — Telehealth: Payer: Self-pay | Admitting: *Deleted

## 2011-09-28 NOTE — Telephone Encounter (Signed)
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 She is in tons of pain, she would like someone to call her back today if possible.

## 2011-09-28 NOTE — Telephone Encounter (Signed)
Per Dr. Ashley Jacobs- advised pt her MRI is normal.  She states she is in extreme pain today, and has been confined to bed.  She has to take 2 norco, and it still does not completely control the pain.  Dr. Ashley Jacobs suggests PT as next step, and ok to call in tramadol.  Pt states she has already done PT and water therapy, and has tramadol at home and has been taking this in combination with the norco.   Will discuss with Dr. Ashley Jacobs tomorrow.

## 2011-09-29 ENCOUNTER — Telehealth: Payer: Self-pay | Admitting: Family Medicine

## 2011-09-29 DIAGNOSIS — M545 Low back pain: Secondary | ICD-10-CM

## 2011-09-29 DIAGNOSIS — G8929 Other chronic pain: Secondary | ICD-10-CM

## 2011-09-29 DIAGNOSIS — M5416 Radiculopathy, lumbar region: Secondary | ICD-10-CM

## 2011-09-29 NOTE — Telephone Encounter (Signed)
I spoke with Debbie [name] regarding her Lumbar spine MRI results which are normal. We discussed the benefit of PT at this point even thoug it did not helped her in the past she was just in a immediate postpartum stage. Now she is far from post partum. She understood and agreed to proceed with PT.She would like to go to [facility] for PT.  We also discussed the benefit of having her evaluated by a PM&R physician if her symptoms do not improve with PT. Recommended to call medicaid and request a list of PM&R physician in the network. We will see her back after completing PT.

## 2011-09-29 NOTE — Telephone Encounter (Signed)
Dr. Ashley Jacobs spoke with pt today.  He reviewed all her records, and is aware that she has tried different forms of PT in the past.  Pt is agreeable to returning to PT.  Will send referral to PT at [facility].

## 2011-10-06 ENCOUNTER — Ambulatory Visit (HOSPITAL_COMMUNITY)
Admission: RE | Admit: 2011-10-06 | Discharge: 2011-10-06 | Disposition: A | Discharge status: Home / Self Care | Payer: Medicaid Other | Source: Ambulatory Visit | Attending: Family Medicine | Admitting: Family Medicine

## 2011-10-06 ENCOUNTER — Telehealth: Payer: Self-pay | Admitting: *Deleted

## 2011-10-06 DIAGNOSIS — IMO0001 Reserved for inherently not codable concepts without codable children: Secondary | ICD-10-CM | POA: Insufficient documentation

## 2011-10-06 DIAGNOSIS — M545 Low back pain, unspecified: Secondary | ICD-10-CM | POA: Insufficient documentation

## 2011-10-06 NOTE — Telephone Encounter (Signed)
Pt called was tearful, requesting a refill on vicodin to help her deal with PT.  States PT is painful for her and she is limited due to pain.  Advised her I would ask Dr. Ashley Jacobs.  Per Dr. Ashley Jacobs- he will not refill her Vicodin, they had discussed this at last visit.  States she can take 2 tramadol 50 mg tabs q 8 hours.  She states she is already on this regimen.  Per Dr. Ashley Jacobs will refer pt to PM&R.

## 2011-10-06 NOTE — Progress Notes (Addendum)
Physical Therapy Evaluation - Medicaid  Patient Details  Name: Debbie [name] MRN: [id number] Date of Birth: 07/16/1986  Today's Date: 10/06/2011 Time: 4098-1191 Time Calculation (min): 45 min Medicaid Number: [id number] t Charges: 1 eval Visit#: 1  of 12   Re-eval: 11/05/11 Assessment Diagnosis: LBP  Past Medical History:  Past Medical History  Diagnosis Date  . Back pain    Past Surgical History:  Past Surgical History  Procedure Date  . Cesarean section     Subjective Symptoms/Limitations Symptoms: Pt reports that she has had low back pain since her pregnancy about a year ago.  Gradually started getting worse in May and June.  She initally thought that her pain was related to her previous pregnancy, however it has continued.  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  Her son was 10.5lbs.  Had an epidural injection and had a c-section. Her BP dropped and she had symptoms of fainting after the epidural.   When she was working, she was [redacted] weeks pregnant and she fell at work from a cramp in her side, and is when her back pain started.  Denies B&B dysfuntion , nauesa, pelvic or hip pain.  Had HTN during pregnancy.   How long can you sit comfortably?: 10 minutes  How long can you stand comfortably?: 5 minutes How long can you walk comfortably?: 25 minutes for exercise with a baby stroller  Pain Assessment Currently in Pain?: Yes Pain Score:   7 Pain Location: Back  Assessment  10/06/11 1300 Assessment Diagnosis LBP Home Living Lives With Spouse;Son Additional Comments Does not have support from family secondary to [place] Prior Function Driving Yes Vocation Other (comment) Vocation Requirements Stay at home with 83 month old child Leisure Hobbies-yes (Comment) Palpation:  Significant spasm to her R gluteal region.  Decreased PA mobility in L3-L5.  Increased pain and tenderness throughout R lumbosacral region w/concentration on  the R side. Lumbar alignment dysfunction.  Body Mechanics Improper body mechanics with lifiting objects from floor to waist RLE Strength Right Hip Flexion 3/5 Right Hip Extension 3-/5 Right Hip ABduction 3+/5 Right Hip ADduction 4/5 Right Knee Flexion 4/5 Right Knee Extension 4/5 LLE Strength Left Hip Flexion 4/5 Left Hip Extension 3+/5 Left Hip ABduction 4/5 Left Hip ADduction 4/5 Left Knee Flexion 4/5 Left Knee Extension 4/5 Lumbar Flexion 3/5 Lumbar Extension  2/5 Ambulation/Gait Ambulation/Gait Yes Lumbar AROM:  Flexion: decreased by 40% with increased pain Extension: WNL - increased pain L SB: decreased 60% with the most increased pain R SB: decreased 15% with increased pain.  Gait Pattern Decreased stance time - right;Decreased stride length;Decreased weight shift to right;Antalgic;Decreased trunk rotation    Exercise/Treatments Stretches Active Hamstring Stretch: 30 seconds;Limitations Active Hamstring Stretch Limitations: BLE Single Knee to Chest Stretch: 30 seconds;Limitations Single Knee to Chest Stretch Limitations: BLE Lower Trunk Rotation: 5 reps Press Ups: 60 seconds Stability Bridge: 10 reps Seated: Roll in and outs with heels and toes 5x5 sec hold   Physical Therapy Assessment and Plan PT Assessment and Plan Clinical Impression Statement: Pt is a 25 year old female referred to PT secondary to acute on chronic back pain.  After examination it was found that she has current body structure impairments including increased low back pain, gluteal and lumbosacral spasms, lumbosacral alignment dysfunction, decreased spinal mobility, antatgic gait, improper body mechanics, decreased LE and core strength and decreased lumbar ROM which is limiting her in her daily activities.  She will benefit from skilled OPPT in order to address the above impairments in order to maximize funciton and improve quality of life.  Rehab Potential: Fair Clinical Impairments Affecting  Rehab Potential: Secondary to limited visits allowed by insurance.  PT Frequency: Min 3X/week PT Duration: 4 weeks PT Treatment/Interventions: Gait training;Functional mobility training;Therapeutic exercise;Patient/family education;Neuromuscular re-education;Balance training;Other (comment) (Manual and modalities for pain control ) PT Plan: Check SI joint/alignment. Add core stabilization and body mechanics for proper lifting of her 52 month old son.     Goals Home Exercise Program Pt will Perform Home Exercise Program: Independently PT Short Term Goals Time to Complete Short Term Goals: 2 weeks PT Short Term Goal 1: Pt will report pain less than or equal to 5/10 for 50% of her day.  PT Short Term Goal 2: Pt will improve LE and core strength by 1 muscle grade.  PT Short Term Goal 3: Pt will demonstrate appropriate body mechanics when lifting her son off the floor with reports of decreased pain.  PT Short Term Goal 4: Pt will present with minimal gluteal and lumbosacral spams with improved lumbar mobility PT Long Term Goals Time to Complete Long Term Goals: 4 weeks PT Long Term Goal 1: Pt will report pain less than 3/10 for 75% of her day for improved quality of life.   PT Long Term Goal 2: Pt will improve lumbar ROM to [facility] in order to perform appropriate body mechanics when lifting son from floor to waist.  Long Term Goal 3: Pt will improve core and LE strength to [facility] in order to ambulate and stand greater than an hour to participate in household activities.  Long Term Goal 4: Pt will present with normalized alignment function.   Problem List Patient Active Problem List  Diagnoses  . Lumbar radiculopathy  . Low back pain  . Chronic right SI joint pain    PT - End of Session Activity Tolerance: Patient limited by pain   Debbie [name] 10/06/2011, 3:12 PM  Physician Documentation Your signature is required to indicate approval of the treatment plan as stated above.  Please sign and  either send electronically or make a copy of this report for your files and return this physician signed original.   Please mark one 1.__approve of plan  2. ___approve of plan with the following conditions.   ______________________________                                                          _____________________ Physician Signature                                                                                                             Date

## 2011-10-12 ENCOUNTER — Ambulatory Visit (HOSPITAL_COMMUNITY): Payer: Self-pay

## 2011-10-13 ENCOUNTER — Ambulatory Visit (HOSPITAL_COMMUNITY): Payer: Self-pay | Admitting: Physical Therapy

## 2011-10-19 ENCOUNTER — Ambulatory Visit (HOSPITAL_COMMUNITY)
Admission: RE | Admit: 2011-10-19 | Discharge: 2011-10-19 | Disposition: A | Discharge status: Home / Self Care | Payer: Medicaid Other | Source: Ambulatory Visit

## 2011-10-19 NOTE — Progress Notes (Signed)
Physical Therapy Treatment Patient Details  Name: Debbie [name] MRN: [id number] Date of Birth: 29-Apr-1986  Today's Date: 10/19/2011 Time: 2952-8413 Time Calculation (min): 47 min Visit#: 2  of 12   Re-eval: 11/05/11  Charge: manual 20 min therex 25 min  Subjective: Symptoms/Limitations Symptoms: Today is not as bad, was horrible last week better today, cant stay in any position for long periods of time.  4/10 pain scale LBP down R LE.  Son weighs 23 lbs and is super mobile, horse holds tasks are difficult to complete. Pain Assessment Currently in Pain?: Yes Pain Score:   4  Objective:   Exercise/Treatments Stretches Active Hamstring Stretch: 2 reps;30 seconds;Limitations Active Hamstring Stretch Limitations: BLE Single Knee to Chest Stretch: 2 reps;30 seconds;Limitations Single Knee to Chest Stretch Limitations: BLE Lower Trunk Rotation: 5 reps;10 seconds Stability Clam: 5 reps;5 seconds Bridge: 10 reps Ab Set: 5 reps;5 seconds Functional Squats: 10 reps;3 seconds;Limitations Functional Squats Limitations: with 8# box for proper lifting Machine Exercises Tread Mill: 6' @ 1.5 following MET  Manual Therapy Manual Therapy: Joint mobilization Joint Mobilization: L anterior rotation MET complete with pubic clearing and TM to follow x 20 min  Physical Therapy Assessment and Plan PT Assessment and Plan Clinical Impression Statement: MET complete with increased lumbar ROM and decreased pain.  Pt educated on proper technique getting in and out of bed and proper lifting/functional squats for her child with demonstration cueing required for proper tech.   PT Plan: Check SI alignment initially, continue with core stability and proper body mechanics.    Goals    Problem List Patient Active Problem List  Diagnoses  . Lumbar radiculopathy  . Low back pain  . Chronic right SI joint pain    PT - End of Session Activity Tolerance: Patient tolerated treatment  well General Behavior During Session: [facility] for tasks performed Cognition: [facility] for tasks performed  Juel Burrow 10/19/2011, 4:16 PM

## 2011-10-25 ENCOUNTER — Ambulatory Visit (HOSPITAL_COMMUNITY): Payer: Self-pay | Admitting: Physical Therapy

## 2011-11-02 ENCOUNTER — Ambulatory Visit (HOSPITAL_COMMUNITY): Payer: Self-pay | Admitting: Physical Therapy

## 2011-11-08 ENCOUNTER — Ambulatory Visit (HOSPITAL_COMMUNITY)
Admission: RE | Admit: 2011-11-08 | Discharge: 2011-11-08 | Disposition: A | Discharge status: Home / Self Care | Payer: Medicaid Other | Source: Ambulatory Visit | Attending: Family Medicine | Admitting: Family Medicine

## 2011-11-08 DIAGNOSIS — M545 Low back pain, unspecified: Secondary | ICD-10-CM | POA: Insufficient documentation

## 2011-11-08 DIAGNOSIS — IMO0001 Reserved for inherently not codable concepts without codable children: Secondary | ICD-10-CM | POA: Insufficient documentation

## 2011-11-08 NOTE — Progress Notes (Signed)
Physical Therapy Treatment/Discharge note  Patient Details  Name: Debbie [name] MRN: [id number] Date of Birth: 1985/12/17  Today's Date: 11/08/2011 Time: 1305-1310 Time Calculation (min): 5 min Visit#:   of    Re-eval:      Subjective: Symptoms/Limitations Symptoms: Pt reports that she has recently returned from [place].  While she was there her son, mother and grandfather all became sick and she became the primary caretaker for 10 days.  She did not have much time to complete her exercises.  She continues to have help from her fiance around the house.   Pt reports that she is confused about her insurance.  She cannot afford therapy and would like to start again in the beginning of the year when she is back on Medicaid 100%,  Pain Assessment Currently in Pain?: Yes  Physical Therapy Assessment and Plan PT Assessment and Plan Clinical Impression Statement: Discussed with patient to continue with therapy at home, and if able to return at the beginning of the year to address her LBP. PT Plan: D/C secondary to financial issues.     Problem List Patient Active Problem List  Diagnoses  . Lumbar radiculopathy  . Low back pain  . Chronic right SI joint pain       Shaquitta Burbridge 11/08/2011, 1:28 PM

## 2011-11-09 ENCOUNTER — Encounter (HOSPITAL_COMMUNITY): Payer: Self-pay

## 2011-11-09 ENCOUNTER — Emergency Department (HOSPITAL_COMMUNITY)
Admission: EM | Admit: 2011-11-09 | Discharge: 2011-11-09 | Disposition: A | Discharge status: Home / Self Care | Payer: Medicaid Other | Attending: Emergency Medicine | Admitting: Emergency Medicine

## 2011-11-09 ENCOUNTER — Emergency Department (HOSPITAL_COMMUNITY): Payer: Medicaid Other

## 2011-11-09 DIAGNOSIS — R1031 Right lower quadrant pain: Secondary | ICD-10-CM | POA: Insufficient documentation

## 2011-11-09 DIAGNOSIS — N949 Unspecified condition associated with female genital organs and menstrual cycle: Secondary | ICD-10-CM | POA: Insufficient documentation

## 2011-11-09 DIAGNOSIS — R102 Pelvic and perineal pain: Secondary | ICD-10-CM

## 2011-11-09 HISTORY — DX: Pelvic and perineal pain: R10.2

## 2011-11-09 HISTORY — DX: Other chronic pain: G89.29

## 2011-11-09 LAB — CBC
HCT: 39.1 % (ref 36.0–46.0)
Platelets: 327 10*3/uL (ref 150–400)
RBC: 4.22 MIL/uL (ref 3.87–5.11)
RDW: 11.8 % (ref 11.5–15.5)
WBC: 10.3 10*3/uL (ref 4.0–10.5)

## 2011-11-09 LAB — URINALYSIS, ROUTINE W REFLEX MICROSCOPIC
Glucose, UA: NEGATIVE mg/dL
Leukocytes, UA: NEGATIVE
Protein, ur: NEGATIVE mg/dL
Specific Gravity, Urine: 1.025 (ref 1.005–1.030)
Urobilinogen, UA: 0.2 mg/dL (ref 0.0–1.0)

## 2011-11-09 LAB — POCT PREGNANCY, URINE: Preg Test, Ur: NEGATIVE

## 2011-11-09 LAB — COMPREHENSIVE METABOLIC PANEL
ALT: 19 U/L (ref 0–35)
AST: 17 U/L (ref 0–37)
CO2: 25 mEq/L (ref 19–32)
Chloride: 106 mEq/L (ref 96–112)
GFR calc non Af Amer: >90 mL/min (ref >90)
Sodium: 139 mEq/L (ref 135–145)
Total Bilirubin: 0.2 mg/dL — ABNORMAL LOW (ref 0.3–1.2)

## 2011-11-09 LAB — DIFFERENTIAL
Basophils Absolute: 0 10*3/uL (ref 0.0–0.1)
Lymphocytes Relative: 15 % (ref 12–46)
Monocytes Absolute: 0.4 10*3/uL (ref 0.1–1.0)
Neutro Abs: 8.4 10*3/uL — ABNORMAL HIGH (ref 1.7–7.7)

## 2011-11-09 MED ORDER — IOHEXOL 300 MG/ML  SOLN
100.0000 mL | Freq: Once | INTRAMUSCULAR | Status: AC | PRN
  Administered 2011-11-09: 100 mL via INTRAVENOUS

## 2011-11-09 MED ORDER — KETOROLAC TROMETHAMINE 60 MG/2ML IM SOLN
60.0000 mg | Freq: Once | INTRAMUSCULAR | Status: AC
  Administered 2011-11-09: 60 mg via INTRAMUSCULAR
  Filled 2011-11-09 (×2): qty 2

## 2011-11-09 MED ORDER — NAPROXEN 250 MG PO TABS: 250.0000 mg | ORAL_TABLET | Freq: Two times a day (BID) | ORAL | Status: DC

## 2011-11-09 MED ORDER — SODIUM CHLORIDE 0.9 % IV SOLN
INTRAVENOUS | Status: DC
  Administered 2011-11-09: 15:00:00 via INTRAVENOUS

## 2011-11-09 MED ORDER — MORPHINE SULFATE 4 MG/ML IJ SOLN
4.0000 mg | INTRAMUSCULAR | Status: DC | PRN
  Administered 2011-11-09: 4 mg via INTRAVENOUS
  Filled 2011-11-09 (×3): qty 1

## 2011-11-09 MED ORDER — HYDROCODONE-ACETAMINOPHEN 5-325 MG PO TABS: ORAL_TABLET | ORAL | Status: AC

## 2011-11-09 MED ORDER — ONDANSETRON HCL 4 MG/2ML IJ SOLN
4.0000 mg | INTRAMUSCULAR | Status: DC | PRN
  Administered 2011-11-09: 4 mg via INTRAVENOUS
  Filled 2011-11-09 (×3): qty 2

## 2011-11-09 MED ORDER — IOHEXOL 300 MG/ML  SOLN
40.0000 mL | Freq: Once | INTRAMUSCULAR | Status: AC | PRN
  Administered 2011-11-09: 40 mL via ORAL

## 2011-11-09 MED ORDER — MORPHINE SULFATE 4 MG/ML IJ SOLN
4.0000 mg | Freq: Once | INTRAMUSCULAR | Status: AC
  Administered 2011-11-09: 4 mg via INTRAMUSCULAR

## 2011-11-09 NOTE — ED Notes (Signed)
Pain level 8\10. Resting in bed on left side. Equal chest rise and fall. Call bell within reach. No distress. MD at bedside to speak with patient about plan of care.

## 2011-11-09 NOTE — ED Notes (Signed)
Patient states she is feeling a little bit better. Patient does not need anything at this time.

## 2011-11-09 NOTE — ED Notes (Signed)
Pt IV access lost when pt ambulated to bathroom. Orders to leave IV out and give Morphine IM.

## 2011-11-09 NOTE — ED Notes (Signed)
Medicated as ordered for 8/10 pain. In no distress. Equal chest rise and fall. Denies any needs. Call bell within reach. Will continue to monitor.

## 2011-11-09 NOTE — ED Notes (Signed)
Charge RN working on getting a bed for pt.

## 2011-11-09 NOTE — ED Notes (Signed)
Patient report given to this nurse. Assuming care of patient. Day time nurse at bedside with MD for pelvic exam at this time.

## 2011-11-09 NOTE — ED Notes (Signed)
MD aware of pain level.  

## 2011-11-09 NOTE — ED Notes (Signed)
Into room to see patient. Resting in bed on left side. Facial grimacing. States abdominal pain is 8\10. Would like something else for pain. Denies any other needs. Equal chest rise and fall. Call bell within reach.

## 2011-11-09 NOTE — ED Provider Notes (Signed)
History     CSN: [id number] Arrival date & time: 11/09/2011  1:41 PM    Chief Complaint  Patient presents with  . Abdominal Pain   HPI Pt was seen at 1430.  Per pt, c/o gradual onset and persistence of constant RLQ abd "pain" x1 week, has been worse over the past 2 days.  Pt states her OB/GYN is "wants to see if I have endometriosis" but she does not have an appt with him until January.  Denies dysuria, no vaginal bleeding/discharge, no flank pain, no N/V/D, no fevers, no rash.   OB/GYN:  Dr. Emelda Fear Past Medical History  Diagnosis Date  . Back pain   . Chronic pelvic pain in female     Past Surgical History  Procedure Date  . Cesarean section     Family History  Problem Relation Age of Onset  . Diabetes Father   . Hypertension Father   . Hypertension Sister     History  Substance Use Topics  . Smoking status: Passive Smoker  . Smokeless tobacco: Not on file  . Alcohol Use: No    OB History    Grav Para Term Preterm Abortions TAB SAB Ect Mult Living   2 1 1  1   1  1       Review of Systems ROS: Statement: All systems negative except as marked or noted in the HPI; Constitutional: Negative for fever and chills. ; ; Eyes: Negative for eye pain, redness and discharge. ; ; ENMT: Negative for ear pain, hoarseness, nasal congestion, sinus pressure and sore throat. ; ; Cardiovascular: Negative for chest pain, palpitations, diaphoresis, dyspnea and peripheral edema. ; ; Respiratory: Negative for cough, wheezing and stridor. ; ; Gastrointestinal: +abd pain.  Negative for nausea, vomiting, diarrhea, blood in stool, hematemesis, jaundice and rectal bleeding. . ; ; Genitourinary: Negative for dysuria, flank pain and hematuria. GYN:  No vaginal bleeding, no vaginal discharge, no vulvar pain.; Musculoskeletal: Negative for back pain and neck pain. Negative for swelling and trauma.; ; Skin: Negative for pruritus, rash, abrasions, blisters, bruising and skin lesion.; ; Neuro: Negative  for headache, lightheadedness and neck stiffness. Negative for weakness, altered level of consciousness , altered mental status, extremity weakness, paresthesias, involuntary movement, seizure and syncope.       Allergies  Review of patient's allergies indicates no known allergies.  Home Medications   Current Outpatient Rx  Name Route Sig Dispense Refill  . ACETAMINOPHEN 500 MG PO TABS Oral Take 500-1,000 mg by mouth daily as needed. For pain     . IBUPROFEN 200 MG PO TABS Oral Take 200-600 mg by mouth daily as needed. For back pain     . MEDROXYPROGESTERONE ACETATE 150 MG/ML IM SUSP Intramuscular Inject 150 mg into the muscle every 3 (three) months.      . TRAMADOL HCL 50 MG PO TABS Oral Take 50 mg by mouth every 6 (six) hours as needed. Maximum dose= 8 tablets per day   pain       BP 132/89  Pulse 103  Temp(Src) 98.4 F (36.9 C) (Oral)  Resp 16  Ht 5\' 2"  (1.575 m)  Wt 184 lb (83.462 kg)  BMI 33.65 kg/m2  SpO2 100%  LMP 09/23/2011  Physical Exam 1435: Physical examination:  Nursing notes reviewed; Vital signs and O2 SAT reviewed;  Constitutional: Well developed, Well nourished, Well hydrated, Uncomfortable appearing; Head:  Normocephalic, atraumatic; Eyes: EOMI, PERRL, No scleral icterus; ENMT: Mouth and pharynx normal, Mucous membranes moist;  Neck: Supple, Full range of motion, No lymphadenopathy; Cardiovascular: Regular rate and rhythm, No murmur, rub, or gallop; Respiratory: Breath sounds clear & equal bilaterally, No rales, rhonchi, wheezes, or rub, Normal respiratory effort/excursion; Chest: Nontender, Movement normal; Abdomen: Soft, +RLQ and suprapubic areas TTP, no rebound or guarding, Nondistended, Normal bowel sounds; Genitourinary: No CVA tenderness;  Pelvic exam performed with permission of pt and female ED tech assist during exam.  External genitalia w/o lesions. Vaginal vault with thin clear discharge.  Cervix w/o lesions, not friable, GC/chlam and wet prep obtained and  sent to lab.  Bimanual exam w/o CMT, uterine or adenexal tenderness. Extremities: Pulses normal, No tenderness, No edema, No calf edema or asymmetry.; Neuro: AA&Ox3, Major CN grossly intact.  No gross focal motor or sensory deficits in extremities.; Skin: Color normal, Warm, Dry, no rash.    ED Course  Procedures  MDM  MDM Reviewed: nursing note and vitals Interpretation: labs and ultrasound     Results for orders placed during the hospital encounter of 11/09/11  URINALYSIS, ROUTINE W REFLEX MICROSCOPIC      Component Value Range   Color, Urine YELLOW  YELLOW    APPearance CLEAR  CLEAR    Specific Gravity, Urine 1.025  1.005 - 1.030    pH 6.0  5.0 - 8.0    Glucose, UA NEGATIVE  NEGATIVE (mg/dL)   Hgb urine dipstick NEGATIVE  NEGATIVE    Bilirubin Urine NEGATIVE  NEGATIVE    Ketones, ur NEGATIVE  NEGATIVE (mg/dL)   Protein, ur NEGATIVE  NEGATIVE (mg/dL)   Urobilinogen, UA 0.2  0.0 - 1.0 (mg/dL)   Nitrite NEGATIVE  NEGATIVE    Leukocytes, UA NEGATIVE  NEGATIVE   POCT PREGNANCY, URINE      Component Value Range   Preg Test, Ur NEGATIVE    CBC      Component Value Range   WBC 10.3  4.0 - 10.5 (K/uL)   RBC 4.22  3.87 - 5.11 (MIL/uL)   Hemoglobin 13.8  12.0 - 15.0 (g/dL)   HCT 16.1  09.6 - 04.5 (%)   MCV 92.7  78.0 - 100.0 (fL)   MCH 32.7  26.0 - 34.0 (pg)   MCHC 35.3  30.0 - 36.0 (g/dL)   RDW 40.9  81.1 - 91.4 (%)   Platelets 327  150 - 400 (K/uL)  DIFFERENTIAL      Component Value Range   Neutrophils Relative 81 (*) 43 - 77 (%)   Neutro Abs 8.4 (*) 1.7 - 7.7 (K/uL)   Lymphocytes Relative 15  12 - 46 (%)   Lymphs Abs 1.6  0.7 - 4.0 (K/uL)   Monocytes Relative 3  3 - 12 (%)   Monocytes Absolute 0.4  0.1 - 1.0 (K/uL)   Eosinophils Relative 0  0 - 5 (%)   Eosinophils Absolute 0.0  0.0 - 0.7 (K/uL)   Basophils Relative 0  0 - 1 (%)   Basophils Absolute 0.0  0.0 - 0.1 (K/uL)  COMPREHENSIVE METABOLIC PANEL      Component Value Range   Sodium 139  135 - 145 (mEq/L)    Potassium 4.0  3.5 - 5.1 (mEq/L)   Chloride 106  96 - 112 (mEq/L)   CO2 25  19 - 32 (mEq/L)   Glucose, Bld 109 (*) 70 - 99 (mg/dL)   BUN 9  6 - 23 (mg/dL)   Creatinine, Ser 7.82  0.50 - 1.10 (mg/dL)   Calcium 95.6  8.4 - 10.5 (mg/dL)  Total Protein 8.0  6.0 - 8.3 (g/dL)   Albumin 4.3  3.5 - 5.2 (g/dL)   AST 17  0 - 37 (U/L)   ALT 19  0 - 35 (U/L)   Alkaline Phosphatase 70  39 - 117 (U/L)   Total Bilirubin 0.2 (*) 0.3 - 1.2 (mg/dL)   GFR calc non Af Amer >90  >90 (mL/min)   GFR calc Af Amer >90  >90 (mL/min)  LIPASE, BLOOD      Component Value Range   Lipase 33  11 - 59 (U/L)  WET PREP, GENITAL      Component Value Range   Yeast, Wet Prep NONE SEEN  NONE SEEN    Trich, Wet Prep NONE SEEN  NONE SEEN    Clue Cells, Wet Prep NONE SEEN  NONE SEEN    WBC, Wet Prep HPF POC FEW (*) NONE SEEN    Ct Abdomen Pelvis W Contrast  11/09/2011  *RADIOLOGY REPORT*  Clinical Data: RLQ abdominal pain  CT ABDOMEN AND PELVIS WITH CONTRAST  Technique:  Multidetector CT imaging of the abdomen and pelvis was performed following the standard protocol during bolus administration of intravenous contrast.  Contrast: 40mL OMNIPAQUE IOHEXOL 300 MG/ML IV SOLN, OMNIPAQUE IOHEXOL 300 MG/ML IV SOLN  Comparison: None.  Findings: Lung bases are clear.  Liver, spleen, pancreas, and adrenal glands are within normal limits.  Gallbladder is unremarkable.  No intrahepatic or extrahepatic ductal dilatation.  Kidneys are within normal limits.  No hydronephrosis.  No evidence of bowel obstruction.  Normal appendix.  No evidence of abdominal aortic aneurysm.  No abdominopelvic ascites.  No suspicious abdominopelvic lymphadenopathy.  Uterus and ovaries are unremarkable.  The bladder is within normal limits.  Visualized osseous structures are within normal limits.  IMPRESSION: Normal appendix.  No evidence of bowel obstruction.  No CT findings to account for the patient's abdominal pain.  Original Report Authenticated By: Charline Bills, M.D.   [place] Art/ven Flow Abd Pelv Doppler  11/09/2011  *RADIOLOGY REPORT*  Clinical Data:  Right-sided pelvic pain.  TRANSABDOMINAL AND TRANSVAGINAL ULTRASOUND OF PELVIS DOPPLER ULTRASOUND OF OVARIES  Technique:  Both transabdominal and transvaginal ultrasound examinations of the pelvis were performed. Transabdominal technique was performed for global imaging of the pelvis including uterus, ovaries, adnexal regions, and pelvic cul-de-sac.  It was necessary to proceed with endovaginal exam following the transabdominal exam to visualize the  endometrium.  Color and duplex Doppler ultrasound was utilized to evaluate blood flow to the ovaries.  Comparison:  Ultrasound dated 08/02/2006  Findings:  Uterus:  Normal. 7.1 x 3.8 x 4.7 cm.  Endometrium:  Normal.  10 mm in thickness.  Right ovary: Normal.  2.4 x 2.5 x 3.3 cm.  Left ovary:   Normal.  2.2 x 1.6 x 2.4 cm.  Pulsed Doppler evaluation demonstrates normal low-resistance arterial and venous waveforms in both ovaries.  No free fluid in the pelvis.  IMPRESSION: Normal exam.  No evidence of pelvic mass or other significant abnormality.  No sonographic evidence for ovarian torsion.  Original Report Authenticated By: Gwynn Burly, M.D.    8:42 PM:  Pt continues to ask for pain meds, but seen walking around ED with steady/upright gait, easy resps, appearing NAD.  Reassured re: dx testing.  Dx testing d/w pt.  Questions answered.  Verb understanding, agreeable to d/c home with outpt f/u.      Talani Brazee Allison Quarry, DO 11/10/11 1201

## 2011-11-09 NOTE — ED Notes (Signed)
Pt reports RLQ pain x 1 week but worse for the past 2 days.  Denies any burning with urination.  Reports LMP was Nov 1st.  Pt on depo.  LBM was this morning and says was a little loose.  Denies history of kidney stones.

## 2011-11-11 LAB — GC/CHLAMYDIA PROBE AMP, GENITAL
Chlamydia, DNA Probe: NEGATIVE
GC Probe Amp, Genital: NEGATIVE

## 2012-01-03 IMAGING — CR DG LUMBAR SPINE COMPLETE 4+V
5 series · 5 of 5 positions shown · non-contrast
Comparison: [DATE]

CLINICAL DATA: 25-year-old female with fall and low back pain.

LUMBAR SPINE - COMPLETE 4+ VIEW

[view not recorded (1 of 5)]
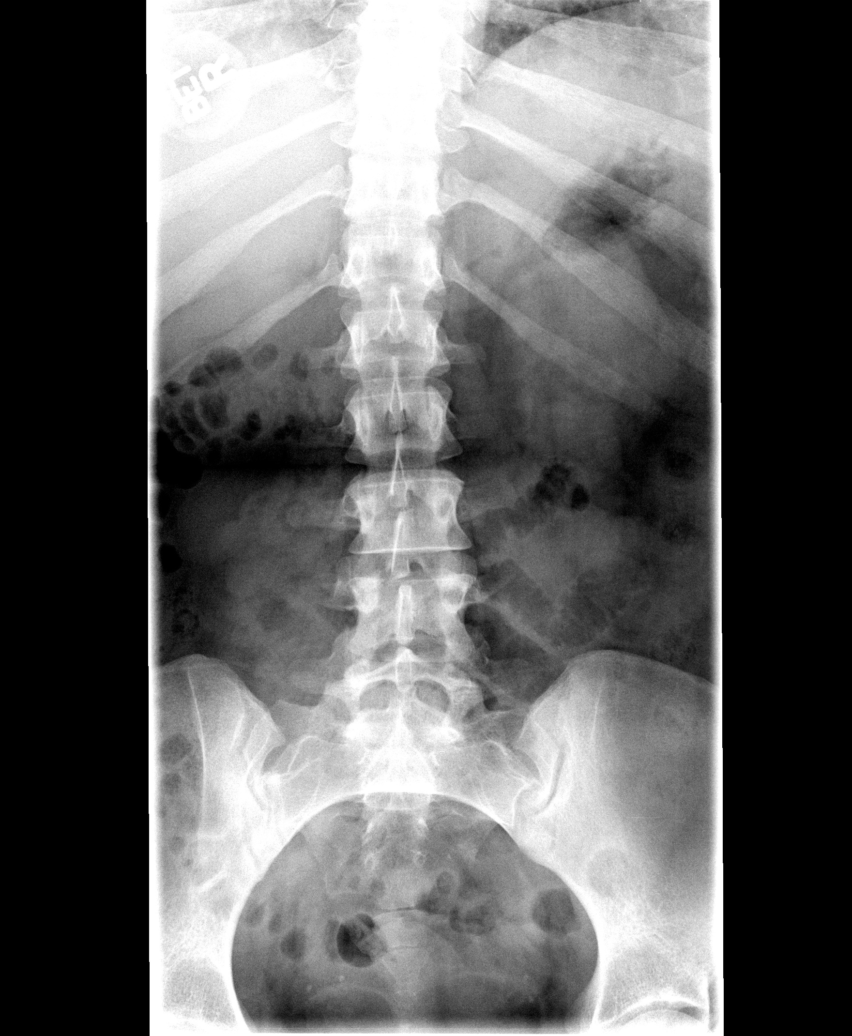

[view not recorded (2 of 5)]
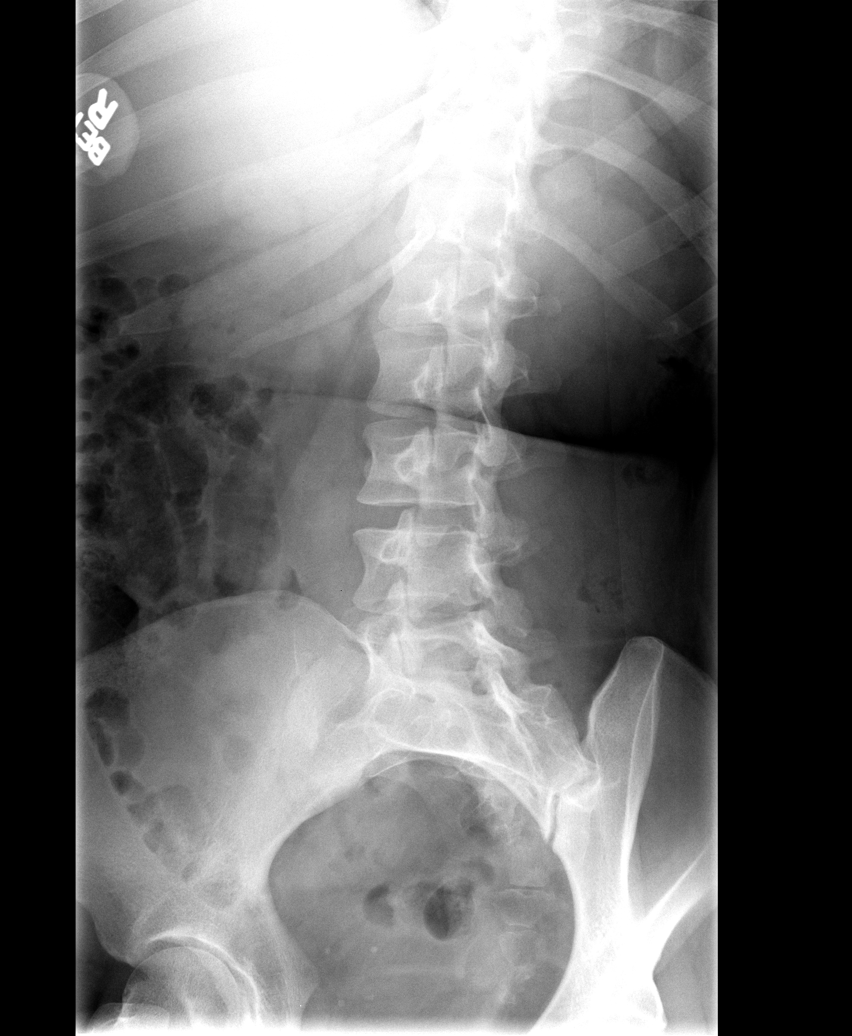

[view not recorded (3 of 5)]
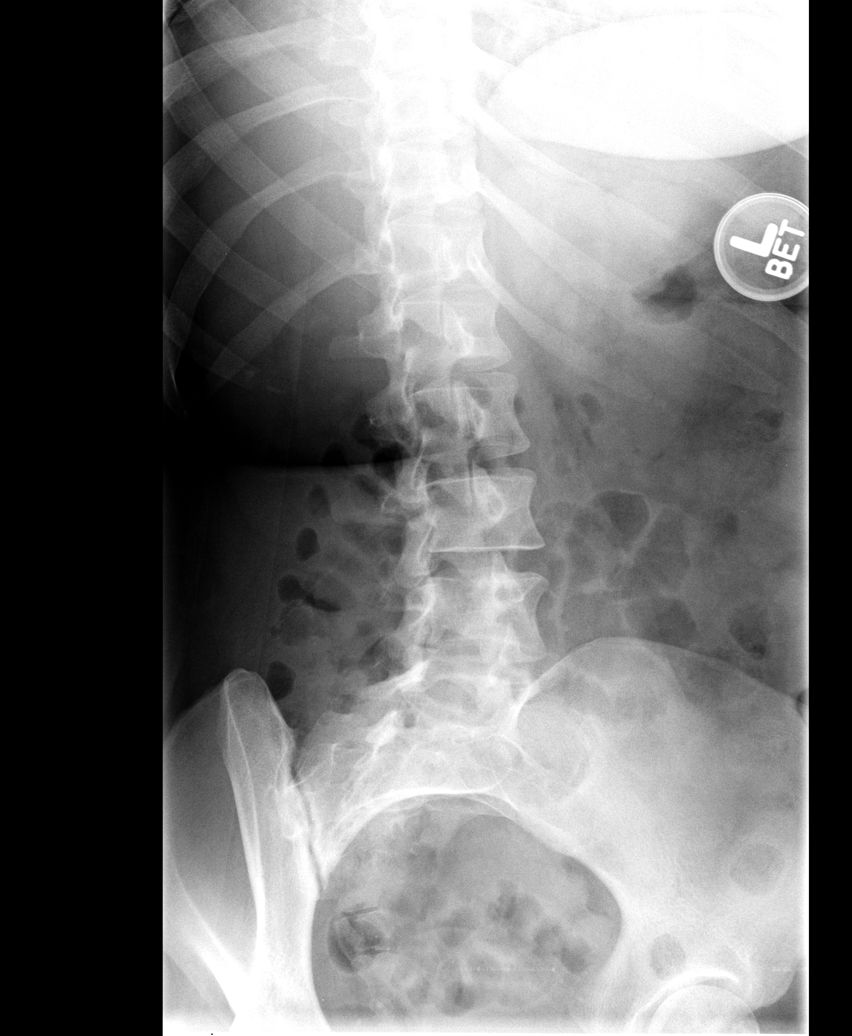

[view not recorded (4 of 5)]
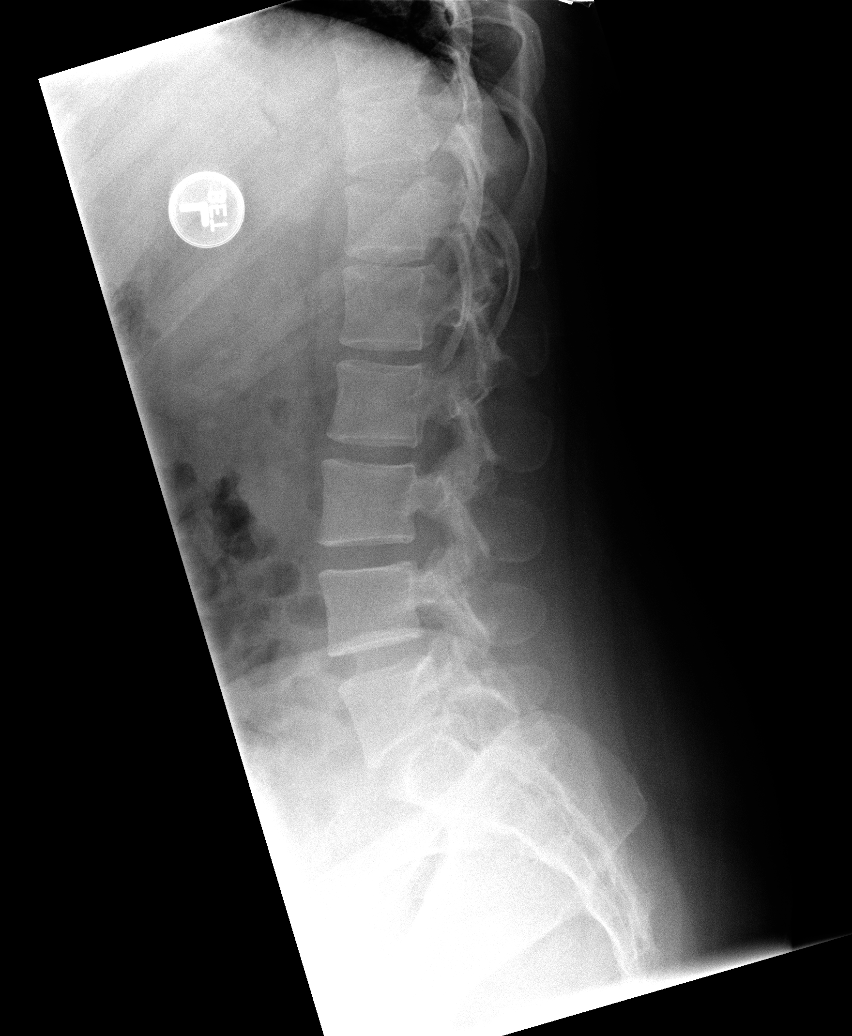

[view not recorded (5 of 5)]
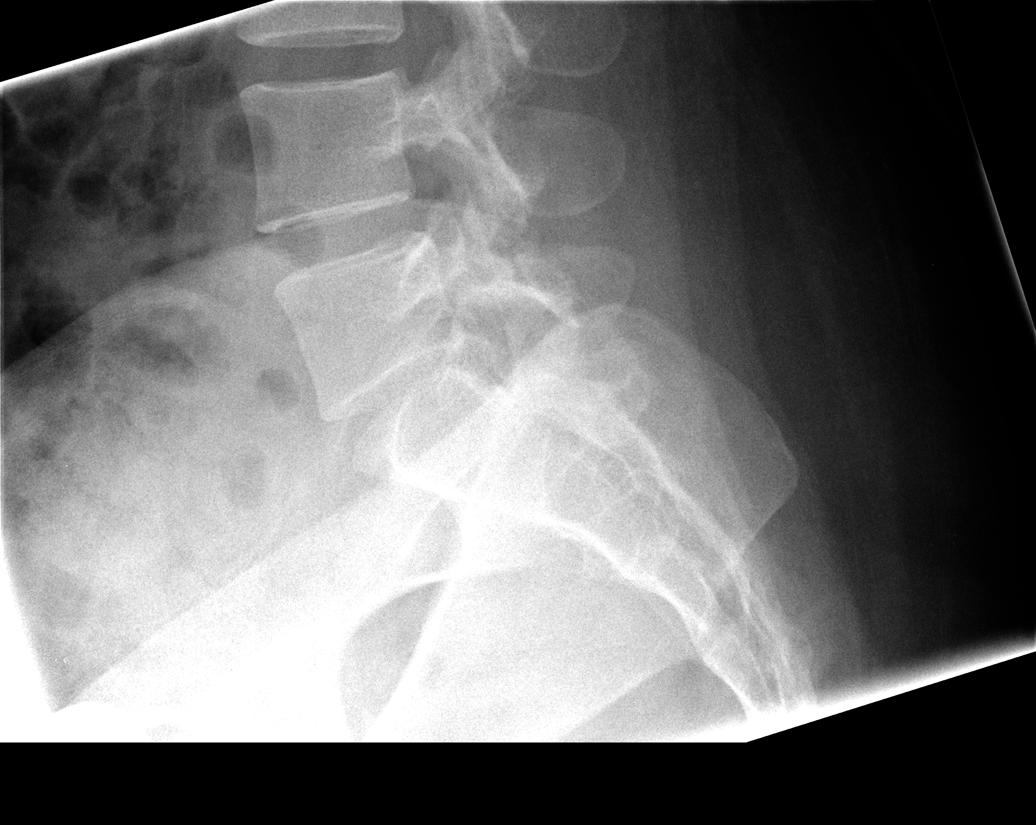

[5 of 5 positions shown; findings below may reference images not displayed]

FINDINGS: Five non-rib bearing lumbar type vertebra are noted in
normal alignment.
There is no evidence of fracture or subluxation.
The disc spaces are maintained.
No focal bony lesions or spondylolysis noted.
IMPRESSION: Unremarkable lumbar spine series.

## 2012-01-12 ENCOUNTER — Encounter: Payer: Self-pay | Admitting: Gastroenterology

## 2012-01-12 ENCOUNTER — Ambulatory Visit (INDEPENDENT_AMBULATORY_CARE_PROVIDER_SITE_OTHER): Payer: Self-pay | Admitting: Gastroenterology

## 2012-01-12 VITALS — BP 145/95 | HR 108 | Temp 98.1°F | Ht 62.0 in | Wt 180.6 lb

## 2012-01-12 DIAGNOSIS — R1031 Right lower quadrant pain: Secondary | ICD-10-CM

## 2012-01-12 NOTE — Progress Notes (Signed)
Primary Care Physician:  [name], MD, MD  Primary Gastroenterologist:  Jonette Eva, MD   Chief Complaint  Patient presents with  . Abdominal Pain    on the right side    HPI:  Debbie [name] is a 26 y.o. female here who presents at request of Dr. Christin Bach for further evaluation of chronic right lower quadrant pain.   Patient gave birth to her son 12/2010 via C-section. She noted symptoms gradually after her surgery. In [place] she noted symptoms really bad/less bearable. C/O pain which started in RLQ but now sometimes in right mid-abdomen. Sharp and stabbing quality. Occurs at any time. Happens most days. Worse with increased activites, worse with applying pressure to have BM. Pain not relieved with BM, not worse with meals. Wakes her up at night. Occuring daily. Tried Bentyl 20mg  TID without improvement. No bowel issues. Rare constipation. Denies chronic issues in past. Although according to notes from Dr. Emelda Fear, there was notation of variation of bowel habits and chronicity of pain dating back to her teens. Patient denies at this time.  ?endometriosis, mother, sisters, aunts. U/S, CT A/P all done without explanation of symptoms. No pp symptoms. Sometimes feel it in the back sometimes. Low back issues. No melena, brbpr. No urinary symptoms. Heating pads, ibuprofen, tylenol. No heartburn, No vomiting.   Current Outpatient Prescriptions  Medication Sig Dispense Refill  . acetaminophen (TYLENOL) 500 MG tablet Take 500-1,000 mg by mouth daily as needed. For pain       . ibuprofen (ADVIL,MOTRIN) 200 MG tablet Take 200-600 mg by mouth daily as needed. For back pain       . medroxyPROGESTERone (DEPO-PROVERA) 150 MG/ML injection Inject 150 mg into the muscle every 3 (three) months.        . naproxen (NAPROSYN) 250 MG tablet Take 1 tablet (250 mg total) by mouth 2 (two) times daily with a meal.  14 tablet  0  . traMADol (ULTRAM) 50 MG tablet Take 50 mg by mouth every 6 (six) hours  as needed. Maximum dose= 8 tablets per day   pain         Allergies as of 01/12/2012  . (No Known Allergies)    Past Medical History  Diagnosis Date  . Back pain   . Chronic pelvic pain in female     Past Surgical History  Procedure Date  . Cesarean section     Family History  Problem Relation Age of Onset  . Diabetes Father   . Hypertension Father   . Hypertension Sister     History   Social History  . Marital Status: Single    Spouse Name: N/A    Number of Children: 1  . Years of Education: N/A   Occupational History  .     Social History Main Topics  . Smoking status: Passive Smoker  . Smokeless tobacco: Not on file  . Alcohol Use: No  . Drug Use: No  . Sexually Active: Not on file   Other Topics Concern  . Not on file   Social History Narrative  . No narrative on file      ROS:  General: Negative for anorexia, weight loss, fever, chills, fatigue, weakness. Eyes: Negative for vision changes.  ENT: Negative for hoarseness, difficulty swallowing , nasal congestion. CV: Negative for chest pain, angina, palpitations, dyspnea on exertion, peripheral edema.  Respiratory: Negative for dyspnea at rest, dyspnea on exertion, cough, sputum, wheezing.  GI: See history of present illness.  GU:  Negative for dysuria, hematuria, urinary incontinence, urinary frequency, nocturnal urination.  MS: Negative for joint pain, low back pain.  Derm: Negative for rash or itching.  Neuro: Negative for weakness, abnormal sensation, seizure, frequent headaches, memory loss, confusion.  Psych: Negative for anxiety, depression, suicidal ideation, hallucinations.  Endo: Negative for unusual weight change.  Heme: Negative for bruising or bleeding. Allergy: Negative for rash or hives.    Physical Examination:  BP 145/95  Pulse 108  Temp(Src) 98.1 F (36.7 C) (Temporal)  Ht 5\' 2"  (1.575 m)  Wt 180 lb 9.6 oz (81.92 kg)  BMI 33.03 kg/m2  LMP 12/04/2011   General:  Well-nourished, well-developed in no acute distress.  Head: Normocephalic, atraumatic.   Eyes: Conjunctiva pink, no icterus. Mouth: Oropharyngeal mucosa moist and pink , no lesions erythema or exudate. Neck: Supple without thyromegaly, masses, or lymphadenopathy.  Lungs: Clear to auscultation bilaterally.  Heart: Regular rate and rhythm, no murmurs rubs or gallops.  Abdomen: Bowel sounds are normal. RLQ tenderness to deep palpation, focal area. Nondistended, no hepatosplenomegaly or masses, no abdominal bruits or    hernia , no rebound or guarding. Negative Carnett's sign.  Rectal: Not performed.  Extremities: No lower extremity edema. No clubbing or deformities.  Neuro: Alert and oriented x 4 , grossly normal neurologically.  Skin: Warm and dry, no rash or jaundice.   Psych: Alert and cooperative, normal mood and affect.  Labs: Lab Results  Component Value Date   WBC 10.3 11/09/2011   HGB 13.8 11/09/2011   HCT 39.1 11/09/2011   MCV 92.7 11/09/2011   PLT 327 11/09/2011     Imaging Studies:  CT A/P 10/2011 Impression: Normal appendix. No bowel obstruction.   MR Lumbar 10/12 Impression: normal  Pelvic u/s 09/2011 unremarkable

## 2012-01-13 ENCOUNTER — Encounter: Payer: Self-pay | Admitting: Gastroenterology

## 2012-01-13 NOTE — Assessment & Plan Note (Signed)
Progressive RLQ pain over the past one year. Patient denies associated change in bowels. Abdominal pain not relieved with BM. Unaffected by meals. Pain worse prior to menses. Pain worse with increased activities. No improvement on Bentyl. Extensive evaluation without clear etiology of pain. No symptoms which warrant endoscopic evaluation. Discussed with Dr. Darrick Penna. At this time, there is no indication that patient's symptoms are from IBS. She does not meet Rome III criteria without altered bowel function. She also has nocturnal symptoms. Would consider diagnostic laparoscopy for further evaluation.

## 2012-01-14 NOTE — Progress Notes (Signed)
Faxed results to PCP 

## 2012-01-14 NOTE — Progress Notes (Signed)
Patient aware of SLF recommendations. I ask her to call of if any new GI symptoms.

## 2012-01-17 ENCOUNTER — Other Ambulatory Visit: Payer: Self-pay | Admitting: Obstetrics and Gynecology

## 2012-01-27 NOTE — Progress Notes (Signed)
REVIEWED. AGREE.  NO WARNING FLAGS FOR GI TRACT.

## 2012-01-28 ENCOUNTER — Encounter (HOSPITAL_COMMUNITY): Payer: Self-pay | Admitting: Pharmacy Technician

## 2012-01-31 ENCOUNTER — Other Ambulatory Visit: Payer: Self-pay | Admitting: Obstetrics and Gynecology

## 2012-01-31 ENCOUNTER — Encounter (HOSPITAL_COMMUNITY): Payer: Self-pay

## 2012-01-31 ENCOUNTER — Encounter (HOSPITAL_COMMUNITY)
Admission: RE | Admit: 2012-01-31 | Discharge: 2012-01-31 | Disposition: A | Discharge status: Home / Self Care | Payer: Medicaid Other | Source: Ambulatory Visit | Attending: Obstetrics and Gynecology | Admitting: Obstetrics and Gynecology

## 2012-01-31 LAB — URINALYSIS, ROUTINE W REFLEX MICROSCOPIC
Bilirubin Urine: NEGATIVE
Glucose, UA: NEGATIVE mg/dL
Ketones, ur: NEGATIVE mg/dL
Leukocytes, UA: NEGATIVE
Nitrite: NEGATIVE
Protein, ur: NEGATIVE mg/dL

## 2012-01-31 LAB — HCG, SERUM, QUALITATIVE: Preg, Serum: NEGATIVE

## 2012-01-31 LAB — CBC
MCH: 32.4 pg (ref 26.0–34.0)
MCHC: 34.9 g/dL (ref 30.0–36.0)
RDW: 12.2 % (ref 11.5–15.5)

## 2012-01-31 LAB — SURGICAL PCR SCREEN: Staphylococcus aureus: NEGATIVE

## 2012-01-31 MED ORDER — MAGNESIUM CITRATE PO SOLN: 1.0000 | Freq: Once | ORAL | Status: DC

## 2012-01-31 NOTE — Patient Instructions (Addendum)
20 Debbie [name]  01/31/2012   Your procedure is scheduled on:  02/04/2012  Report to [facility] at  615  AM.  Call this number if you have problems the morning of surgery: [phone]   Remember:   Do not eat food:After Midnight.  May have clear liquids:until Midnight .  Clear liquids include soda, tea, black coffee, apple or grape juice, broth.  Take these medicines the morning of surgery with A SIP OF WATER: ultram   Do not wear jewelry, make-up or nail polish.  Do not wear lotions, powders, or perfumes. You may wear deodorant.  Do not shave 48 hours prior to surgery.  Do not bring valuables to the hospital.  Contacts, dentures or bridgework may not be worn into surgery.  Leave suitcase in the car. After surgery it may be brought to your room.  For patients admitted to the hospital, checkout time is 11:00 AM the day of discharge.   Patients discharged the day of surgery will not be allowed to drive home.  Name and phone number of your driver: family  Special Instructions: CHG Shower Use Special Wash: 1/2 bottle night before surgery and 1/2 bottle morning of surgery.   Please read over the following fact sheets that you were given: Pain Booklet, MRSA Information and Surgical Site Infection Prevention PATIENT INSTRUCTIONS POST-ANESTHESIA  IMMEDIATELY FOLLOWING SURGERY:  Do not drive or operate machinery for the first twenty four hours after surgery.  Do not make any important decisions for twenty four hours after surgery or while taking narcotic pain medications or sedatives.  If you develop intractable nausea and vomiting or a severe headache please notify your doctor immediately.  FOLLOW-UP:  Please make an appointment with your surgeon as instructed. You do not need to follow up with anesthesia unless specifically instructed to do so.  WOUND CARE INSTRUCTIONS (if applicable):  Keep a dry clean dressing on the anesthesia/puncture wound site if there is drainage.  Once the wound  has quit draining you may leave it open to air.  Generally you should leave the bandage intact for twenty four hours unless there is drainage.  If the epidural site drains for more than 36-48 hours please call the anesthesia department.  QUESTIONS?:  Please feel free to call your physician or the hospital operator if you have any questions, and they will be happy to assist you.     [facility] Anesthesia Department [address] [place] [phone]    Diagnostic Laparoscopy Laparoscopy is a surgical procedure. It is used to diagnose and treat diseases inside the belly(abdomen). It is usually a brief, common, and relatively simple procedure. The laparoscopeis a thin, lighted, pencil-sized instrument. It is like a telescope. It is inserted into your abdomen through a small cut (incision). Your caregiver can look at the organs inside your body through this instrument. He or she can see if there is anything abnormal. Laparoscopy can be done either in a hospital or outpatient clinic. You may be given a mild sedative to help you relax before the procedure. Once in the operating room, you will be given a drug to make you sleep (general anesthesia). Laparoscopy usually lasts less than 1 hour. After the procedure, you will be monitored in a recovery area until you are stable and doing well. Once you are home, it will take 2 to 3 days to fully recover. RISKS AND COMPLICATIONS  Laparoscopy has relatively few risks. Your caregiver will discuss the risks with you  before the procedure. Some problems that can occur include:  Infection.   Bleeding.   Damage to other organs.   Anesthetic side effects.  PROCEDURE Once you receive anesthesia, your surgeon inflates the abdomen with a harmless gas (carbon dioxide). This makes the organs easier to see. The laparoscope is inserted into the abdomen through a small incision. This allows your surgeon to see into the abdomen. Other small instruments are  also inserted into the abdomen through other small openings. Many surgeons attach a video camera to the laparoscope to enlarge the view. During a diagnostic laparoscopy, the surgeon may be looking for inflammation, infection, or cancer. Your surgeon may take tissue samples(biopsies). The samples are sent to a specialist in looking at cells and tissue samples (pathologist). The pathologist examines them under a microscope. Biopsies can help to diagnose or confirm a disease. AFTER THE PROCEDURE   The gas is released from inside the abdomen.   The incisions are closed with stitches (sutures). Because these incisions are small (usually less than 1/2 inch), there is usually minimal discomfort after the procedure. There may be some mild discomfort in the throat. This is from the tube placed in the throat while you were sleeping. You may have some mild abdominal discomfort. There may also be discomfort from the instrument placement incisions in the abdomen.   The recovery time is shortened as long as there are no complications.   You will rest in a recovery room until stable and doing well. As long as there are no complications, you may be allowed to go home.  FINDING OUT THE RESULTS OF YOUR TEST Not all test results are available during your visit. If your test results are not back during the visit, make an appointment with your caregiver to find out the results. Do not assume everything is normal if you have not heard from your caregiver or the medical facility. It is important for you to follow up on all of your test results. HOME CARE INSTRUCTIONS   Take all medicines as directed.   Only take over-the-counter or prescription medicines for pain, discomfort, or fever as directed by your caregiver.   Resume daily activities as directed.   Showers are preferred over baths.   You may resume sexual activities in 1 week or as directed.   Do not drive while taking narcotics.  SEEK MEDICAL CARE IF:    There is increasing abdominal pain.   There is new pain in the shoulders (shoulder strap areas).   You feel lightheaded or faint.   You have the chills.   You or your child has an oral temperature above 102 F (38.9 C).   There is pus-like (purulent) drainage from any of the wounds.   You are unable to pass gas or have a bowel movement.   You feel sick to your stomach (nauseous) or throw up (vomit).  MAKE SURE YOU:   Understand these instructions.   Will watch your condition.   Will get help right away if you are not doing well or get worse.  Document Released: 02/14/2001 Document Revised: 10/28/2011 Document Reviewed: 11/08/2007 [facility] Patient Information 2012 [place], [place].

## 2012-02-03 ENCOUNTER — Other Ambulatory Visit: Payer: Self-pay | Admitting: Obstetrics and Gynecology

## 2012-02-03 NOTE — H&P (Signed)
Debbie [name] is an 26 y.o. female. She is admitted for diagnostic laparoscopy and possible appendectomy. She's been evaluated thoroughly with chronic "unbearable to patient" pain predominantly in the left lower quadrant of a 3 month history with intermittent episodes gravida 2 para 1011 who underwent primary cesarean section 01/07/2011 she feels that she's had the pain since the surgery particular last 3 months and this way there were similar complaints intermittently prior to the pregnancy during the pregnancy patient is perceived S. painful to level tearfulness. There's been no fever no associated pain with bowel movements.. She's had gastrointestinal consult per Dr. fields and this did not seem to fit the diagnosis her normal bowel and patient requesting White count 6900 normal differential normal electrolytes and no STD history laparoscopy is being performed to rule out endometriosis or postsurgical adhesions bowel prep is planned general surgical consult is planned to consider appendectomy at time of the surgical procedure  Pertinent Gynecological History: Menses: flow is moderate Bleeding:  Contraception: abstinence DES exposure: denies Blood transfusions: none Sexually transmitted diseases: no past history Previous GYN Procedures: Cesarean section x1  Last mammogram: Not applicable Date:  Last pap: normal Date: 05/28/2011 OB History: G2, P 1011   Menstrual History: Menarche age:  Patient's last menstrual period was 12/04/2011.    Past Medical History  Diagnosis Date  . Back pain   . Chronic pelvic pain in female     Past Surgical History  Procedure Date  . Cesarean section 2012    Womens  . Wisdom tooth extraction     Family History  Problem Relation Age of Onset  . Diabetes Father   . Hypertension Father   . Hypertension Sister   . Colon cancer Neg Hx   . Anesthesia problems Neg Hx   . Hypotension Neg Hx   . Malignant hyperthermia Neg Hx   . Pseudochol  deficiency Neg Hx   . Stomach cancer Paternal Grandfather   . Lung cancer Other     both grandparents    Social History:  reports that she quit smoking about 13 months ago. Her smoking use included Cigarettes. She has a 1.5 pack-year smoking history. She does not have any smokeless tobacco history on file. She reports that she does not drink alcohol or use illicit drugs.  Allergies: No Known Allergies   (Not in a hospital admission)  ROS negative for fevers chills (first symptoms shortness of breath.  patient denies history of sexual abuse  Last menstrual period 12/04/2011. Physical ExamPhysical Examination: General appearance - alert, well appearing, and in no distress, oriented to person, place, and time and anxious Mental status - alert, oriented to person, place, and time, normal mood, behavior, speech, dress, motor activity, and thought processes Mouth - mucous membranes moist, pharynx normal without lesions and dental hygiene good Neck - supple, no significant adenopathy Chest - clear to auscultation, no wheezes, rales or rhonchi, symmetric air entry Heart - normal rate and regular rhythm Abdomen - soft, nontender, nondistended, no masses or organomegaly scars from previous incisions Pfannenstiel lower abdominal incision well healed  Pelvic - VULVA: normal appearing vulva with no masses, tenderness or lesions, VAGINA: normal appearing vagina with normal color and discharge, no lesions, CERVIX: normal appearing cervix without discharge or lesions, UTERUS: uterus is normal size, shape, consistency and nontender, anteverted, mobile, ADNEXA: normal adnexa in size, nontender and no masses, tenderness left, no masses Extremities - peripheral pulses normal, no pedal edema, no clubbing or cyanosis, Homan's sign negative bilaterally    No results found for this or any previous visit (from the past 24 hour(s)).  No results found.  Assessment/Chronic pelvic pain left sided predominance,  intermittently debilitating the patient rule out endometriosis Plan: Diagnostic laparoscopy lysis of adhesions if necessary consideration of incidental appendectomy 02/04/2012 7:30 AM bowel prep planned Dr. Jenkins available surgeon   Erza Mothershead V 02/03/2012, 7:41 PM  

## 2012-02-04 ENCOUNTER — Encounter (HOSPITAL_COMMUNITY): Payer: Self-pay | Admitting: *Deleted

## 2012-02-04 ENCOUNTER — Encounter (HOSPITAL_COMMUNITY)
Admission: RE | Disposition: A | Discharge status: Home / Self Care | Payer: Self-pay | Source: Ambulatory Visit | Attending: Obstetrics and Gynecology

## 2012-02-04 ENCOUNTER — Encounter (HOSPITAL_COMMUNITY): Payer: Self-pay | Admitting: Anesthesiology

## 2012-02-04 ENCOUNTER — Ambulatory Visit (HOSPITAL_COMMUNITY): Payer: Medicaid Other | Admitting: Anesthesiology

## 2012-02-04 ENCOUNTER — Ambulatory Visit (HOSPITAL_COMMUNITY)
Admission: RE | Admit: 2012-02-04 | Discharge: 2012-02-04 | Disposition: A | Discharge status: Home / Self Care | Payer: Medicaid Other | Source: Ambulatory Visit | Attending: Obstetrics and Gynecology | Admitting: Obstetrics and Gynecology

## 2012-02-04 DIAGNOSIS — R102 Pelvic and perineal pain: Secondary | ICD-10-CM | POA: Diagnosis present

## 2012-02-04 DIAGNOSIS — G8929 Other chronic pain: Secondary | ICD-10-CM | POA: Diagnosis present

## 2012-02-04 DIAGNOSIS — R1031 Right lower quadrant pain: Secondary | ICD-10-CM | POA: Insufficient documentation

## 2012-02-04 HISTORY — PX: LAPAROSCOPIC APPENDECTOMY: SHX408

## 2012-02-04 HISTORY — PX: LAPAROSCOPY: SHX197

## 2012-02-04 SURGERY — LAPAROSCOPY, DIAGNOSTIC
Anesthesia: General | Site: Abdomen | Wound class: Clean Contaminated

## 2012-02-04 MED ORDER — FENTANYL CITRATE 0.05 MG/ML IJ SOLN
INTRAMUSCULAR | Status: DC | PRN
  Administered 2012-02-04 (×6): 50 ug via INTRAVENOUS
  Administered 2012-02-04: 150 ug via INTRAVENOUS

## 2012-02-04 MED ORDER — MIDAZOLAM HCL 2 MG/2ML IJ SOLN
INTRAMUSCULAR | Status: AC
  Filled 2012-02-04: qty 2

## 2012-02-04 MED ORDER — SODIUM CHLORIDE 0.9 % IR SOLN
Status: DC | PRN
  Administered 2012-02-04: 3000 mL

## 2012-02-04 MED ORDER — ACETAMINOPHEN 325 MG PO TABS: 325.0000 mg | ORAL_TABLET | ORAL | Status: DC | PRN

## 2012-02-04 MED ORDER — FENTANYL CITRATE 0.05 MG/ML IJ SOLN
25.0000 ug | INTRAMUSCULAR | Status: DC | PRN
  Administered 2012-02-04 (×4): 50 ug via INTRAVENOUS

## 2012-02-04 MED ORDER — MIDAZOLAM HCL 5 MG/5ML IJ SOLN
INTRAMUSCULAR | Status: DC | PRN
  Administered 2012-02-04: 2 mg via INTRAVENOUS

## 2012-02-04 MED ORDER — BUPIVACAINE HCL (PF) 0.25 % IJ SOLN
INTRAMUSCULAR | Status: AC
  Filled 2012-02-04: qty 30

## 2012-02-04 MED ORDER — ROCURONIUM BROMIDE 50 MG/5ML IV SOLN
INTRAVENOUS | Status: AC
  Filled 2012-02-04: qty 1

## 2012-02-04 MED ORDER — CEFAZOLIN SODIUM 1-5 GM-% IV SOLN
INTRAVENOUS | Status: AC
  Filled 2012-02-04: qty 50

## 2012-02-04 MED ORDER — GLYCOPYRROLATE 0.2 MG/ML IJ SOLN
INTRAMUSCULAR | Status: AC
  Filled 2012-02-04: qty 1

## 2012-02-04 MED ORDER — ACETAMINOPHEN 10 MG/ML IV SOLN
1000.0000 mg | Freq: Once | INTRAVENOUS | Status: AC
  Administered 2012-02-04: 1000 mg via INTRAVENOUS

## 2012-02-04 MED ORDER — ROCURONIUM BROMIDE 100 MG/10ML IV SOLN
INTRAVENOUS | Status: DC | PRN
  Administered 2012-02-04: 35 mg via INTRAVENOUS

## 2012-02-04 MED ORDER — ONDANSETRON HCL 4 MG/2ML IJ SOLN
4.0000 mg | Freq: Once | INTRAMUSCULAR | Status: AC
  Administered 2012-02-04: 4 mg via INTRAVENOUS

## 2012-02-04 MED ORDER — FENTANYL CITRATE 0.05 MG/ML IJ SOLN
INTRAMUSCULAR | Status: AC
  Filled 2012-02-04: qty 2

## 2012-02-04 MED ORDER — LIDOCAINE HCL (PF) 1 % IJ SOLN
INTRAMUSCULAR | Status: AC
  Filled 2012-02-04: qty 5

## 2012-02-04 MED ORDER — ACETAMINOPHEN 10 MG/ML IV SOLN
INTRAVENOUS | Status: AC
  Administered 2012-02-04: 1000 mg via INTRAVENOUS
  Filled 2012-02-04: qty 100

## 2012-02-04 MED ORDER — PROPOFOL 10 MG/ML IV BOLUS
INTRAVENOUS | Status: DC | PRN
  Administered 2012-02-04: 150 mg via INTRAVENOUS

## 2012-02-04 MED ORDER — ONDANSETRON HCL 4 MG/2ML IJ SOLN: 4.0000 mg | Freq: Once | INTRAMUSCULAR | Status: DC | PRN

## 2012-02-04 MED ORDER — NEOSTIGMINE METHYLSULFATE 1 MG/ML IJ SOLN
INTRAMUSCULAR | Status: DC | PRN
  Administered 2012-02-04: 3 mg via INTRAVENOUS

## 2012-02-04 MED ORDER — CEFAZOLIN SODIUM 1-5 GM-% IV SOLN
INTRAVENOUS | Status: DC | PRN
  Administered 2012-02-04: 1 g via INTRAVENOUS

## 2012-02-04 MED ORDER — HYDROMORPHONE HCL PF 1 MG/ML IJ SOLN
0.5000 mg | INTRAMUSCULAR | Status: DC | PRN
  Administered 2012-02-04 (×2): 0.5 mg via INTRAVENOUS

## 2012-02-04 MED ORDER — GLYCOPYRROLATE 0.2 MG/ML IJ SOLN
0.2000 mg | Freq: Once | INTRAMUSCULAR | Status: AC
  Administered 2012-02-04: 08:00:00 via INTRAVENOUS

## 2012-02-04 MED ORDER — LIDOCAINE HCL 1 % IJ SOLN
INTRAMUSCULAR | Status: DC | PRN
  Administered 2012-02-04: 50 mg via INTRADERMAL

## 2012-02-04 MED ORDER — CEFAZOLIN SODIUM 1-5 GM-% IV SOLN: 1.0000 g | INTRAVENOUS | Status: DC

## 2012-02-04 MED ORDER — OXYCODONE-ACETAMINOPHEN 5-325 MG PO TABS: 1.0000 | ORAL_TABLET | ORAL | Status: AC | PRN

## 2012-02-04 MED ORDER — FENTANYL CITRATE 0.05 MG/ML IJ SOLN
INTRAMUSCULAR | Status: AC
  Administered 2012-02-04: 50 ug via INTRAVENOUS
  Filled 2012-02-04: qty 2

## 2012-02-04 MED ORDER — LACTATED RINGERS IV SOLN: INTRAVENOUS | Status: DC

## 2012-02-04 MED ORDER — BUPIVACAINE HCL (PF) 0.25 % IJ SOLN
INTRAMUSCULAR | Status: DC | PRN
  Administered 2012-02-04: 8 mL

## 2012-02-04 MED ORDER — LACTATED RINGERS IV SOLN
INTRAVENOUS | Status: DC
  Administered 2012-02-04 (×2): via INTRAVENOUS

## 2012-02-04 MED ORDER — PROMETHAZINE HCL 25 MG PO TABS: 25.0000 mg | ORAL_TABLET | Freq: Four times a day (QID) | ORAL | Status: DC | PRN

## 2012-02-04 MED ORDER — MIDAZOLAM HCL 2 MG/2ML IJ SOLN
INTRAMUSCULAR | Status: AC
  Administered 2012-02-04: 2 mg via INTRAVENOUS
  Filled 2012-02-04: qty 2

## 2012-02-04 MED ORDER — NEOSTIGMINE METHYLSULFATE 1 MG/ML IJ SOLN
INTRAMUSCULAR | Status: AC
  Filled 2012-02-04: qty 10

## 2012-02-04 MED ORDER — 0.9 % SODIUM CHLORIDE (POUR BTL) OPTIME
TOPICAL | Status: DC | PRN
  Administered 2012-02-04: 1000 mL

## 2012-02-04 MED ORDER — PROPOFOL 10 MG/ML IV EMUL
INTRAVENOUS | Status: AC
  Filled 2012-02-04: qty 20

## 2012-02-04 MED ORDER — ONDANSETRON HCL 4 MG/2ML IJ SOLN
INTRAMUSCULAR | Status: AC
  Administered 2012-02-04: 4 mg via INTRAVENOUS
  Filled 2012-02-04: qty 2

## 2012-02-04 MED ORDER — FENTANYL CITRATE 0.05 MG/ML IJ SOLN
INTRAMUSCULAR | Status: AC
  Administered 2012-02-04: 50 ug via INTRAVENOUS
  Filled 2012-02-04: qty 5

## 2012-02-04 MED ORDER — MIDAZOLAM HCL 2 MG/2ML IJ SOLN
1.0000 mg | INTRAMUSCULAR | Status: DC | PRN
  Administered 2012-02-04: 2 mg via INTRAVENOUS

## 2012-02-04 MED ORDER — HYDROMORPHONE HCL PF 1 MG/ML IJ SOLN
INTRAMUSCULAR | Status: AC
  Administered 2012-02-04: 0.5 mg via INTRAVENOUS
  Filled 2012-02-04: qty 1

## 2012-02-04 MED ORDER — METHYLENE BLUE 1 % INJ SOLN
INTRAMUSCULAR | Status: AC
  Filled 2012-02-04: qty 1

## 2012-02-04 MED ORDER — GLYCOPYRROLATE 0.2 MG/ML IJ SOLN
INTRAMUSCULAR | Status: DC | PRN
  Administered 2012-02-04: .4 mg via INTRAVENOUS

## 2012-02-04 SURGICAL SUPPLY — 42 items
BAG HAMPER (MISCELLANEOUS) ×3 IMPLANT
BLADE SURG SZ11 CARB STEEL (BLADE) ×3 IMPLANT
CLOTH BEACON ORANGE TIMEOUT ST (SAFETY) ×3 IMPLANT
COVER SURGICAL LIGHT HANDLE (MISCELLANEOUS) ×6 IMPLANT
CUTTER LINEAR ENDO 35 ETS TH (STAPLE) ×3 IMPLANT
DRESSING COVERLET 3X1 FLEXIBLE (GAUZE/BANDAGES/DRESSINGS) ×3 IMPLANT
ELECT REM PT RETURN 9FT ADLT (ELECTROSURGICAL) ×3
ELECTRODE REM PT RTRN 9FT ADLT (ELECTROSURGICAL) ×2 IMPLANT
GLOVE BIO SURGEON STRL SZ7 (GLOVE) ×3 IMPLANT
GLOVE ECLIPSE 9.0 STRL (GLOVE) ×3 IMPLANT
GLOVE EXAM NITRILE MD LF STRL (GLOVE) ×6 IMPLANT
GLOVE INDICATOR STER SZ 9 (GLOVE) ×3 IMPLANT
GOWN STRL REIN 3XL LVL4 (GOWN DISPOSABLE) ×3 IMPLANT
GOWN STRL REIN XL XLG (GOWN DISPOSABLE) ×3 IMPLANT
INST SET LAPROSCOPIC GYN AP (KITS) ×3 IMPLANT
KIT ROOM TURNOVER AP CYSTO (KITS) ×3 IMPLANT
MANIFOLD NEPTUNE II (INSTRUMENTS) ×3 IMPLANT
NEEDLE HYPO 18GX1.5 BLUNT FILL (NEEDLE) ×3 IMPLANT
NEEDLE HYPO 25X1 1.5 SAFETY (NEEDLE) ×3 IMPLANT
NEEDLE INSUFFLATION 14GA 120MM (NEEDLE) ×3 IMPLANT
PACK PERI GYN (CUSTOM PROCEDURE TRAY) ×3 IMPLANT
PAD ARMBOARD 7.5X6 YLW CONV (MISCELLANEOUS) ×3 IMPLANT
SCALPEL HARMONIC ACE (MISCELLANEOUS) ×3 IMPLANT
SET BASIN LINEN APH (SET/KITS/TRAYS/PACK) ×3 IMPLANT
SET TUBE IRRIG SUCTION NO TIP (IRRIGATION / IRRIGATOR) ×3 IMPLANT
SOL PREP PROV IODINE SCRUB 4OZ (MISCELLANEOUS) ×3 IMPLANT
SOLUTION ANTI FOG 6CC (MISCELLANEOUS) ×3 IMPLANT
SPONGE GAUZE 4X4 12PLY (GAUZE/BANDAGES/DRESSINGS) ×3 IMPLANT
STAPLER VISISTAT 35W (STAPLE) ×3 IMPLANT
STRIP CLOSURE SKIN 1/4X3 (GAUZE/BANDAGES/DRESSINGS) ×3 IMPLANT
SUT VICRYL 0 UR6 27IN ABS (SUTURE) ×3 IMPLANT
SUT VICRYL AB 3-0 FS1 BRD 27IN (SUTURE) ×3 IMPLANT
SYR CONTROL 10ML LL (SYRINGE) ×3 IMPLANT
SYRINGE 10CC LL (SYRINGE) ×3 IMPLANT
TAPE CLOTH SURG 4X10 WHT LF (GAUZE/BANDAGES/DRESSINGS) ×3 IMPLANT
TRAY FOLEY CATH 16FR SILVER (SET/KITS/TRAYS/PACK) ×3 IMPLANT
TROCAR Z-THAD FIOS HNDL 12X100 (TROCAR) ×3 IMPLANT
TROCAR Z-THRD FIOS HNDL 11X100 (TROCAR) ×3 IMPLANT
TROCAR Z-THREAD FIOS 5X100MM (TROCAR) ×3 IMPLANT
TUBING DYE INJECTION 900-617 (MISCELLANEOUS) IMPLANT
TUBING INSUFFLATION HIGH FLOW (TUBING) ×3 IMPLANT
WARMER LAPAROSCOPE (MISCELLANEOUS) ×3 IMPLANT

## 2012-02-04 NOTE — Discharge Instructions (Signed)
General Gynecological Post-Operative Instructions °You may expect to feel dizzy, weak, and drowsy for as long as 24 hours after receiving the medicine that made you sleep (anesthetic). The following information pertains to your recovery period for the first 24 hours following surgery.  °Do not drive a car, ride a bicycle, participate in physical activities, or take public transportation until you are done taking narcotic pain medicines or as directed by your caregiver.  °Do not drink alcohol or take tranquilizers.  °Do not take medicine that has not been prescribed by your caregiver.  °Do not sign important papers or make important decisions while on narcotic pain medicines.  °Have a responsible person with you.  °CARE OF INCISION  °Keep incision clean and dry. °Take showers instead of baths until your caregiver gives you permission to take baths. Check with your caregiver if you have tubes coming from the wound site.  °Avoid heavy lifting (more than 10 pounds/4.5 kilograms), pushing, or pulling.  °Avoid activities that may risk injury to your surgical site.  °Only take over-the-counter or prescription medicines for pain, discomfort, or fever as directed by your caregiver. Do not take aspirin. It can make you bleed. Take medicines (antibiotics) that kill germs as directed.  °Call the office or go to the MAU if:  °You feel sick to your stomach (nauseous).  °You start to throw up (vomit).  °You have trouble eating or drinking.  °You have an oral temperature above 100.4.  °You have constipation that is not helped by adjusting diet or increasing fluid intake. Pain medicines are a common cause of constipation.  °SEEK IMMEDIATE MEDICAL CARE IF:  °You have persistent dizziness.  °You have difficulty breathing or a congested sounding (croupy) cough.  °You have an oral temperature above 102.5, not controlled by medicine.  °There is increasing pain or tenderness near or in the surgical site.  °[employer]® Patient Information  ©2011 [employer]. ° °

## 2012-02-04 NOTE — H&P (View-Only) (Signed)
Debbie [name] is an 26 y.o. female. She is admitted for diagnostic laparoscopy and possible appendectomy. She's been evaluated thoroughly with chronic "unbearable to patient" pain predominantly in the left lower quadrant of a 3 month history with intermittent episodes gravida 2 para 1011 who underwent primary cesarean section 01/07/2011 she feels that she's had the pain since the surgery particular last 3 months and this way there were similar complaints intermittently prior to the pregnancy during the pregnancy patient is perceived S. painful to level tearfulness. There's been no fever no associated pain with bowel movements.Marland Kitchen She's had gastrointestinal consult per Dr. Darrick Penna and this did not seem to fit the diagnosis her normal bowel and patient requesting White count 6900 normal differential normal electrolytes and no STD history laparoscopy is being performed to rule out endometriosis or postsurgical adhesions bowel prep is planned general surgical consult is planned to consider appendectomy at time of the surgical procedure  Pertinent Gynecological History: Menses: flow is moderate Bleeding:  Contraception: abstinence DES exposure: denies Blood transfusions: none Sexually transmitted diseases: no past history Previous GYN Procedures: Cesarean section x1  Last mammogram: Not applicable Date:  Last pap: normal Date: 05/28/2011 OB History: G2, P 1011   Menstrual History: Menarche age:  Patient's last menstrual period was 12/04/2011.    Past Medical History  Diagnosis Date  . Back pain   . Chronic pelvic pain in female     Past Surgical History  Procedure Date  . Cesarean section 2012    [facility]om[facility]  . Wisdom tooth extraction     Family History  Problem Relation Age of Onset  . Diabetes Father   . Hypertension Father   . Hypertension Sister   . Colon cancer Neg Hx   . Anesthesia problems Neg Hx   . Hypotension Neg Hx   . Malignant hyperthermia Neg Hx   . Pseudochol  deficiency Neg Hx   . Stomach cancer Paternal Grandfather   . Lung cancer Other     both grandparents    Social History:  reports that she quit smoking about 13 months ago. Her smoking use included Cigarettes. She has a 1.5 pack-year smoking history. She does not have any smokeless tobacco history on file. She reports that she does not drink alcohol or use illicit drugs.  Allergies: No Known Allergies   (Not in a hospital admission)  ROS negative for fevers chills (first symptoms shortness of breath.  patient denies history of sexual abuse  Last menstrual period 12/04/2011. Physical ExamPhysical Examination: General appearance - alert, well appearing, and in no distress, oriented to person, place, and time and anxious Mental status - alert, oriented to person, place, and time, normal mood, behavior, speech, dress, motor activity, and thought processes Mouth - mucous membranes moist, pharynx normal without lesions and dental hygiene good Neck - supple, no significant adenopathy Chest - clear to auscultation, no wheezes, rales or rhonchi, symmetric air entry Heart - normal rate and regular rhythm Abdomen - soft, nontender, nondistended, no masses or organomegaly scars from previous incisions Pfannenstiel lower abdominal incision well healed  Pelvic - VULVA: normal appearing vulva with no masses, tenderness or lesions, VAGINA: normal appearing vagina with normal color and discharge, no lesions, CERVIX: normal appearing cervix without discharge or lesions, UTERUS: uterus is normal size, shape, consistency and nontender, anteverted, mobile, ADNEXA: normal adnexa in size, nontender and no masses, tenderness left, no masses Extremities - peripheral pulses normal, no pedal edema, no clubbing or cyanosis, Homan's sign negative bilaterally  No results found for this or any previous visit (from the past 24 hour(s)).  No results found.  Assessment/Chronic pelvic pain left sided predominance,  intermittently debilitating the patient rule out endometriosis Plan: Diagnostic laparoscopy lysis of adhesions if necessary consideration of incidental appendectomy 02/04/2012 7:30 AM bowel prep planned Dr. Lovell Sheehan available surgeon   [name] 02/03/2012, 7:41 PM

## 2012-02-04 NOTE — Anesthesia Procedure Notes (Signed)
Procedure Name: Intubation Date/Time: 02/04/2012 8:04 AM Performed by: Despina Hidden Pre-anesthesia Checklist: Suction available, Emergency Drugs available, Patient identified and Patient being monitored Patient Re-evaluated:Patient Re-evaluated prior to inductionOxygen Delivery Method: Circle system utilized Preoxygenation: Pre-oxygenation with 100% oxygen Intubation Type: IV induction Ventilation: Mask ventilation without difficulty Laryngoscope Size: 3 and Mac Grade View: Grade I Tube type: Oral Tube size: 7.0 mm Number of attempts: 1 Airway Equipment and Method: Stylet Secured at: 22 cm Tube secured with: Tape Dental Injury: Teeth and Oropharynx as per pre-operative assessment

## 2012-02-04 NOTE — Interval H&P Note (Signed)
History and Physical Interval Note:  02/04/2012 7:46 AM  Debbie [name]  has presented today for surgery, with the diagnosis of lower left quadrant pain pelvic pain  The various methods of treatment have been discussed with the patient and family. After consideration of risks, benefits and other options for treatment, the patient has consented to  Procedure(s) (LRB): LAPAROSCOPY DIAGNOSTIC (N/A) as a surgical intervention .  The patients' history has been reviewed, patient examined, no change in status, stable for surgery.  I have reviewed the patients' chart and labs.  Questions were answered to the patient's satisfaction.     [name]  Patient's pain is on the Right side predominantly according to patient discussion today.  The prior [name]  Re: left sided predominance will be corrected.  The patient had 3 loose BM yesterday.

## 2012-02-04 NOTE — Brief Op Note (Signed)
02/04/2012  9:17 AM  PATIENT:  Debbie [name]  26 y.o. female  PRE-OPERATIVE DIAGNOSIS:  lower right quadrant pain pelvic pain  POST-OPERATIVE DIAGNOSIS:  lower right quadrant pain pelvic pain  PROCEDURE:  Procedure(s) (LRB): LAPAROSCOPY DIAGNOSTIC (N/A) APPENDECTOMY LAPAROSCOPIC (N/A)  SURGEON:  Surgeon(s) and Role:    * [name], MD - Primary    * Dalia Heading, MD - Assisting  PHYSICIAN ASSISTANT:   ASSISTANTS: Blackwell CST   ANESTHESIA:   general  EBL:  Total I/O In: 1400 [I.V.:1400] Out: -   BLOOD ADMINISTERED:none  DRAINS: none   LOCAL MEDICATIONS USED:  MARCAINE    and Amount: 8 ml  SPECIMEN:  Source of Specimen:  Appendix  DISPOSITION OF SPECIMEN:  PATHOLOGY  COUNTS:  YES  TOURNIQUET:  * No tourniquets in log *  DICTATION: .Dragon Dictation Pivoting the operating room prepped and draped for lower bowel surgery with legs in low lithotomy like support was conducted and surgical team confirmed procedure Ancef administered preoperatively. Foley catheter was in place and Hulka  tenaculum attached to the uterus. Umbilical vertical skin incision was performed as well as suprapubic 1 and were left lower quadrant. An Veress needle was placed through the umbilicus pneumoperitoneum achieved and then a 10 mm laparoscopic trocar placed without difficulty visualizing the pelvis. No evidence of trauma associated with entry. Suprapubic and left lower quadrant trochars were inserted without difficulty and the 11 mm suprapubic and 5 mm right lower quadrant. Attention was directed to the pelvis were omental adhesions to the intra-abdominal wall were taken down with harmonic scalpel. Some thin filmy adhesions in the bladder flap area that appeared to be tight, on the right side were taken down being careful to stay away from the bladder itself. Round ligament on the right showed some attention was considered normal photos were taken of the grossly normal left and right  tubes and ovaries. Pelvis was inspected further and the cul-de-sac was completely normal without evidence of endometriosis. Surgery was then turned over to Dr. Lovell Sheehan for the appendectomy.  Laparoscopic appendectomy was performed see his notes for surgical detail. Pelvis was further inspected, and consider hemostatic and photos taken of the appendiceal stump showed a healthy surgically well closed appendix.   Reason pelvis followed, deflation of the abdomen, then closure of the suprapubic and umbilical fascia and then subcuticular closure all 3 ports airstrips were applied Band-Aids applied the patient went to recovery in stable condition. IV acetaminophen will be used to address postoperative pain outpatient care is anticipated   PLAN OF CARE: Discharge to home after PACU  PATIENT DISPOSITION:  PACU - hemodynamically stable.   Delay start of Pharmacological VTE agent (>24hrs) due to surgical blood loss or risk of bleeding: not applicable

## 2012-02-04 NOTE — Transfer of Care (Signed)
Immediate Anesthesia Transfer of Care Note  Patient: Debbie [name]  Procedure(s) Performed: Procedure(s) (LRB): LAPAROSCOPY DIAGNOSTIC (N/A) APPENDECTOMY LAPAROSCOPIC (N/A)  Patient Location: PACU  Anesthesia Type: General  Level of Consciousness: awake and patient cooperative  Airway & Oxygen Therapy: Patient Spontanous Breathing and Patient connected to face mask oxygen  Post-op Assessment: Report given to PACU RN, Post -op Vital signs reviewed and stable and Patient moving all extremities  Post vital signs: Reviewed and stable  Complications: No apparent anesthesia complications

## 2012-02-04 NOTE — Anesthesia Preprocedure Evaluation (Signed)
Anesthesia Evaluation  Patient identified by MRN, date of birth, ID band Patient awake    Reviewed: Allergy & Precautions, H&P , NPO status , Patient's Chart, lab work & pertinent test results  Airway Mallampati: I TM Distance: >3 FB Neck ROM: Full    Dental No notable dental hx.    Pulmonary neg pulmonary ROS,    Pulmonary exam normal       Cardiovascular negative cardio ROS  Rhythm:Regular Rate:Normal     Neuro/Psych  Neuromuscular disease negative psych ROS   GI/Hepatic negative GI ROS, Neg liver ROS,   Endo/Other  negative endocrine ROS  Renal/GU negative Renal ROS     Musculoskeletal negative musculoskeletal ROS (+)   Abdominal Normal abdominal exam  (+)   Peds  Hematology negative hematology ROS (+)   Anesthesia Other Findings   Reproductive/Obstetrics negative OB ROS                           Anesthesia Physical Anesthesia Plan  ASA: I  Anesthesia Plan: General   Post-op Pain Management:    Induction: Intravenous  Airway Management Planned: Oral ETT  Additional Equipment:   Intra-op Plan:   Post-operative Plan: Extubation in OR  Informed Consent: I have reviewed the patients History and Physical, chart, labs and discussed the procedure including the risks, benefits and alternatives for the proposed anesthesia with the patient or authorized representative who has indicated his/her understanding and acceptance.     Plan Discussed with: CRNA  Anesthesia Plan Comments:         Anesthesia Quick Evaluation

## 2012-02-04 NOTE — Op Note (Signed)
Patient:  Debbie [name]  DOB:  [birth year]  MRN:  [id number]   Preop Diagnosis:  Chronic abdominal pain  Postop Diagnosis:  Same  Procedure:  Incidental laparoscopic appendectomy  Surgeon:  Franky Macho, M.D.  Anes:  General endotracheal  Indications:  Patient is a 26 year old white female who was undergoing a diagnostic laparoscopy by Dr. Christin Bach of gynecology for chronic right lower quadrant abdominal pain when Dr. Emelda Fear aspirin intraoperative consultation for removal of the appendix.  Procedure note:  Patient was already intubated in the lithotomy position. Trochars were in place. The appendix was noted to be long somewhat tortuous. There was no evidence of acute appendicitis. The mesial appendix was divided using the harmonic scalpel. A vascular Endo GIA was placed across the base the appendix and fired. The appendix was then removed using an Endo Catch bag and sent to pathology further examination. The staple line was inspected and noted within normal limits. No bleeding was noted. The terminal ileum was inspected and was no evidence of Crohn's disease. The ileum was inspected and there was no evidence of a Meckel's diverticulum.  Once I completed my portion of the procedure, Dr. Emelda Fear continued his portion of the procedure.  Complications:  None  EBL:  Minimal  Specimen:  Appendix

## 2012-02-04 NOTE — Anesthesia Postprocedure Evaluation (Signed)
  Anesthesia Post-op Note  Patient: Debbie [name]  Procedure(s) Performed: Procedure(s) (LRB): LAPAROSCOPY DIAGNOSTIC (N/A) APPENDECTOMY LAPAROSCOPIC (N/A)  Patient Location: PACU  Anesthesia Type: General  Level of Consciousness: awake, alert , oriented and patient cooperative  Airway and Oxygen Therapy: Patient Spontanous Breathing  Post-op Pain: 4 /10, moderate  Post-op Assessment: Post-op Vital signs reviewed, Patient's Cardiovascular Status Stable, Respiratory Function Stable, Patent Airway and No signs of Nausea or vomiting  Post-op Vital Signs: Reviewed and stable  Complications: No apparent anesthesia complications

## 2012-02-04 NOTE — Op Note (Signed)
operative details dictated in brief op note.

## 2012-02-07 ENCOUNTER — Encounter (HOSPITAL_COMMUNITY): Payer: Self-pay | Admitting: Obstetrics and Gynecology

## 2012-02-10 NOTE — Addendum Note (Signed)
Addendum  created 02/10/12 1127 by Franco Nones, CRNA   Modules edited:Charges VN

## 2012-02-14 ENCOUNTER — Encounter: Payer: Self-pay | Admitting: Physical Medicine & Rehabilitation

## 2012-02-14 ENCOUNTER — Telehealth: Payer: Self-pay | Admitting: Physical Medicine & Rehabilitation

## 2012-02-14 ENCOUNTER — Encounter: Payer: Medicaid Other | Attending: Physical Medicine & Rehabilitation

## 2012-02-14 ENCOUNTER — Ambulatory Visit (HOSPITAL_BASED_OUTPATIENT_CLINIC_OR_DEPARTMENT_OTHER): Payer: Medicaid Other | Admitting: Physical Medicine & Rehabilitation

## 2012-02-14 VITALS — BP 179/98 | HR 134 | Resp 18 | Ht 62.0 in | Wt 186.0 lb

## 2012-02-14 DIAGNOSIS — M25569 Pain in unspecified knee: Secondary | ICD-10-CM | POA: Insufficient documentation

## 2012-02-14 DIAGNOSIS — M25562 Pain in left knee: Secondary | ICD-10-CM | POA: Insufficient documentation

## 2012-02-14 DIAGNOSIS — M549 Dorsalgia, unspecified: Secondary | ICD-10-CM | POA: Insufficient documentation

## 2012-02-14 DIAGNOSIS — G8929 Other chronic pain: Secondary | ICD-10-CM | POA: Insufficient documentation

## 2012-02-14 DIAGNOSIS — M533 Sacrococcygeal disorders, not elsewhere classified: Secondary | ICD-10-CM

## 2012-02-14 DIAGNOSIS — M545 Low back pain: Secondary | ICD-10-CM

## 2012-02-14 DIAGNOSIS — M25561 Pain in right knee: Secondary | ICD-10-CM

## 2012-02-14 MED ORDER — ALPRAZOLAM 1 MG PO TABS: 1.0000 mg | ORAL_TABLET | Freq: Once | ORAL | Status: AC

## 2012-02-14 MED ORDER — TRAMADOL HCL 50 MG PO TABS: 50.0000 mg | ORAL_TABLET | Freq: Three times a day (TID) | ORAL | Status: AC | PRN

## 2012-02-14 NOTE — Telephone Encounter (Signed)
ERROR

## 2012-02-14 NOTE — Progress Notes (Signed)
Subjective:    Patient ID: Debbie [name], female    DOB: 1986/09/24, 26 y.o.   MRN: [id number]  HPI Chronic knee pain since childhood, had a fall August 2011. Was not hospitalized. Did not seek medical care.has seen physical therapy on 2 occasions.has seen chiropractic in [place].the patient has alternating knee pain. There has not been trauma to the knees. No x-rays have been done. She has had some type of injections in the low back area but these were not done with fluoroscopic guidance. There is been no sensory loss or falls. Patient has difficulty walking from time to time.the patient has seen sports medicine and was prescribed hydrocodone and prednisone for suspected radiculopathy.  MRI performed on 08/12/2011 was normal as seen below Pain Inventory Average Pain 7 Pain Right Now 8 My pain is constant, sharp, burning, dull, stabbing, tingling and aching  In the last 24 hours, has pain interfered with the following? General activity 7 Relation with others 7 Enjoyment of life 8 What TIME of day is your pain at its worst? evening Sleep (in general) Poor  Pain is worse with: walking, bending, standing and some activites Pain improves with: rest, heat/ice and medication Relief from Meds: 9  Mobility walk without assistance how many minutes can you walk? 10 ability to climb steps?  yes do you drive?  no  Function not employed: date last employed 06/2010 I need assistance with the following:  household duties and shopping  Neuro/Psych weakness numbness tingling trouble walking anxiety  Prior Studies CT/MRI *RADIOLOGY REPORT*  Clinical Data: Low back and right leg pain.  MRI LUMBAR SPINE WITHOUT CONTRAST  Technique: Multiplanar and multiecho pulse sequences of the lumbar  spine were obtained without intravenous contrast.  Comparison: Plain films lumbar spine 08/12/2011.  Findings: Vertebral body height, signal and alignment are normal.  No pars interarticularis  defects identified. The conus medullaris  is normal in signal and position. The central spinal canal and  neural foramina widely patent at all levels. Visualized  paraspinous structures are unremarkable.  IMPRESSION:  Normal exam.  Original Report Authenticated By: Bernadene Bell. Maricela Curet, M.D.  Physicians involved in your care Primary care Dr Emelda Fear (gyn) Orthopedist Dr Marcelle Smiling      Review of Systems  Gastrointestinal: Positive for nausea, abdominal pain and diarrhea.  Musculoskeletal: Positive for back pain and gait problem.  Neurological: Positive for weakness and numbness.  Psychiatric/Behavioral: The patient is nervous/anxious.   All other systems reviewed and are negative.       Objective:   Physical Exam  Constitutional: She is oriented to person, place, and time. She appears well-developed and well-nourished.  Musculoskeletal:       Right hip: Normal.       Left hip: Normal.       Right knee: Normal.       Left knee: Normal.       Lumbar back: She exhibits tenderness and pain. She exhibits no bony tenderness, no swelling and no edema.       Back:  Neurological: She is alert and oriented to person, place, and time. She has normal strength. No sensory deficit. She exhibits normal muscle tone. Gait abnormal.  Reflex Scores:      Tricep reflexes are 2+ on the right side and 2+ on the left side.      Bicep reflexes are 2+ on the right side and 2+ on the left side.      Brachioradialis reflexes are 2+ on the right side  and 2+ on the left side.      Patellar reflexes are 2+ on the right side and 2+ on the left side.      Achilles reflexes are 2+ on the right side and 2+ on the left side.      Antalgic gait  Psychiatric: Her speech is normal and behavior is normal. Judgment and thought content normal. Her affect is labile. Cognition and memory are normal.          Assessment & Plan:  1.  Back pain radiating toward the hip buttock thighs and knees. History of a fall  onto her buttocks more than one year ago. Workup thus far has been normal including MRI of the lumbar spine, thoracic and lumbar x-rays, pelvic and hip x-rays. As I discussed with the patient this may represent a sacroiliac disorder although  chiropractic should have helped it.I would recommend a sacroiliac injection under fluoroscopic guidance as long as the patient is not pregnant. I will recommend anti-inflammatories as well as a muscle relaxer at night. In terms of further workup I will recommend knee x-rays, if the sacroiliac injection is not helpful we may need to do a bone scan.  Patient has tried meloxicam, Robaxin, these 2 are not helpful however the tramadol was helpful in combination with another medicine.I have instructed patient to use Tylenol with the tramadol if needed.

## 2012-02-14 NOTE — Patient Instructions (Signed)
Sacroiliac Joint Dysfunction The sacroiliac joint connects the lower part of the spine (the sacrum) with the bones of the pelvis. CAUSES  Sometimes, there is no obvious reason for sacroiliac joint dysfunction. Other times, it may occur   During pregnancy.   After injury, such as:   Car accidents.   Sport-related injuries.   Work-related injuries.   Due to one leg being shorter than the other.   Due to other conditions that affect the joints, such as:   Rheumatoid arthritis.   Gout.   Psoriasis.   Joint infection (septic arthritis).  SYMPTOMS  Symptoms may include:  Pain in the:   Lower back.   Buttocks.   Groin.   Thighs and legs.   Difficult sitting, standing, walking, lying, bending or lifting.  DIAGNOSIS  A number of tests may be used to help diagnose the cause of sacroiliac joint dysfunction, including:  Imaging tests to look for other causes of pain, including:   MRI.   CT scan.   Bone scan.   Diagnostic injection: During a special x-ray (called fluoroscopy), a needle is put into the sacroiliac joint. A numbing medicine is injected into the joint. If the pain is improved or stopped, the diagnosis of sacroiliac joint dysfunction is more likely.  TREATMENT  There are a number of types of treatment used for sacroiliac joint dysfunction, including:  Only take over-the-counter or prescription medicines for pain, discomfort, or fever as directed by your caregiver.   Medications to relax muscles.   Rest. Decreasing activity can help cut down on painful muscle spasms and allow the back to heal.   Application of heat or ice to the lower back may improve muscle spasms and soothe pain.   Brace. A special back brace, called a sacroiliac belt, can help support the joint while your back is healing.   Physical therapy can help teach comfortable positions and exercises to strengthen muscles that support the sacroiliac joint.   Cortisone injections. Injections  of steroid medicine into the joint can help decrease swelling and improve pain.   Hyaluronic acid injections. This chemical improves lubrication within the sacroiliac joint, thereby decreasing pain.   Radiofrequency ablation. A special needle is placed into the joint, where it burns away nerves that are carrying pain messages from the joint.   Surgery. Because pain occurs during movement of the joint, screws and plates may be installed in order to limit or prevent joint motion.  HOME CARE INSTRUCTIONS   Take all medications exactly as directed.   Follow instructions regarding both rest and physical activity, to avoid worsening the pain.   Do physical therapy exercises exactly as prescribed.  SEEK IMMEDIATE MEDICAL CARE IF:  You experience increasingly severe pain.   You develop new symptoms, such as numbness or tingling in your legs or feet.   You lose bladder or bowel control.  Document Released: 02/04/2009 Document Revised: 10/28/2011 Document Reviewed: 02/04/2009 Exit[employer] Patient Information 2012 [employer]. 

## 2012-02-24 ENCOUNTER — Encounter: Payer: Self-pay | Admitting: Physical Medicine & Rehabilitation

## 2012-02-25 ENCOUNTER — Ambulatory Visit: Payer: Self-pay | Admitting: Physical Medicine & Rehabilitation

## 2012-02-29 ENCOUNTER — Ambulatory Visit: Payer: Self-pay | Admitting: Physical Medicine & Rehabilitation

## 2012-03-06 ENCOUNTER — Encounter: Payer: Medicaid Other | Attending: Physical Medicine & Rehabilitation

## 2012-03-06 ENCOUNTER — Ambulatory Visit: Payer: Medicaid Other | Admitting: Physical Medicine & Rehabilitation

## 2012-03-06 DIAGNOSIS — M549 Dorsalgia, unspecified: Secondary | ICD-10-CM | POA: Insufficient documentation

## 2012-03-06 DIAGNOSIS — G8929 Other chronic pain: Secondary | ICD-10-CM | POA: Insufficient documentation

## 2012-03-06 DIAGNOSIS — M25569 Pain in unspecified knee: Secondary | ICD-10-CM | POA: Insufficient documentation

## 2012-03-08 ENCOUNTER — Telehealth: Payer: Self-pay

## 2012-03-08 ENCOUNTER — Telehealth: Payer: Self-pay | Admitting: Physical Medicine & Rehabilitation

## 2012-03-08 NOTE — Telephone Encounter (Signed)
Has a question about medication

## 2012-03-08 NOTE — Telephone Encounter (Signed)
Pt contacted about medication question by Milinda Pointer CMA.  Encounter open erroneously.

## 2012-03-08 NOTE — Telephone Encounter (Signed)
Pt having issues with her pharmacy not wanting to fill her Tramadol. I called her pharmacy and asked if they could get that filled for her.

## 2012-03-27 ENCOUNTER — Encounter: Payer: Medicaid Other | Attending: Physical Medicine & Rehabilitation

## 2012-03-27 ENCOUNTER — Ambulatory Visit: Payer: Medicaid Other | Admitting: Physical Medicine & Rehabilitation

## 2012-03-27 DIAGNOSIS — M25569 Pain in unspecified knee: Secondary | ICD-10-CM | POA: Insufficient documentation

## 2012-03-27 DIAGNOSIS — M549 Dorsalgia, unspecified: Secondary | ICD-10-CM | POA: Insufficient documentation

## 2012-03-27 DIAGNOSIS — G8929 Other chronic pain: Secondary | ICD-10-CM | POA: Insufficient documentation

## 2012-04-04 ENCOUNTER — Emergency Department (HOSPITAL_COMMUNITY): Payer: Medicaid Other

## 2012-04-04 ENCOUNTER — Emergency Department (HOSPITAL_COMMUNITY)
Admission: EM | Admit: 2012-04-04 | Discharge: 2012-04-05 | Disposition: A | Discharge status: Home / Self Care | Payer: Medicaid Other | Attending: Emergency Medicine | Admitting: Emergency Medicine

## 2012-04-04 ENCOUNTER — Encounter (HOSPITAL_COMMUNITY): Payer: Self-pay | Admitting: *Deleted

## 2012-04-04 DIAGNOSIS — M549 Dorsalgia, unspecified: Secondary | ICD-10-CM

## 2012-04-04 DIAGNOSIS — M545 Low back pain, unspecified: Secondary | ICD-10-CM | POA: Insufficient documentation

## 2012-04-04 DIAGNOSIS — M25561 Pain in right knee: Secondary | ICD-10-CM

## 2012-04-04 DIAGNOSIS — W108XXA Fall (on) (from) other stairs and steps, initial encounter: Secondary | ICD-10-CM | POA: Insufficient documentation

## 2012-04-04 DIAGNOSIS — M25569 Pain in unspecified knee: Secondary | ICD-10-CM | POA: Insufficient documentation

## 2012-04-04 DIAGNOSIS — IMO0001 Reserved for inherently not codable concepts without codable children: Secondary | ICD-10-CM | POA: Insufficient documentation

## 2012-04-04 DIAGNOSIS — W19XXXA Unspecified fall, initial encounter: Secondary | ICD-10-CM

## 2012-04-04 DIAGNOSIS — M79609 Pain in unspecified limb: Secondary | ICD-10-CM | POA: Insufficient documentation

## 2012-04-04 LAB — URINALYSIS, ROUTINE W REFLEX MICROSCOPIC
Glucose, UA: NEGATIVE mg/dL
Hgb urine dipstick: NEGATIVE
Ketones, ur: 15 mg/dL — AB
Protein, ur: NEGATIVE mg/dL

## 2012-04-04 NOTE — ED Notes (Signed)
Moving furniture, coming down steps, went down to knees and then fell backwards. Pain both knees and low back.

## 2012-04-05 MED ORDER — IBUPROFEN 800 MG PO TABS
800.0000 mg | ORAL_TABLET | Freq: Once | ORAL | Status: AC
  Administered 2012-04-05: 800 mg via ORAL
  Filled 2012-04-05: qty 1

## 2012-04-05 MED ORDER — HYDROCODONE-ACETAMINOPHEN 5-325 MG PO TABS: 1.0000 | ORAL_TABLET | ORAL | Status: AC | PRN

## 2012-04-05 MED ORDER — HYDROCODONE-ACETAMINOPHEN 5-325 MG PO TABS
2.0000 | ORAL_TABLET | Freq: Once | ORAL | Status: AC
  Administered 2012-04-05: 2 via ORAL
  Filled 2012-04-05: qty 2

## 2012-04-05 NOTE — ED Provider Notes (Signed)
History     CSN: [id number]  Arrival date & time 04/04/12  2202   First MD Initiated Contact with Patient 04/04/12 2307      Chief Complaint  Patient presents with  . Fall    (Consider location/radiation/quality/duration/timing/severity/associated sxs/prior treatment) HPI Debbie [name] is a 26 y.o. female who presents to the Emergency Department complaining of fall while carrying furniture down stairs. Feel initially on both knees then twisted to get out of the way of the furniture with pain now to the right lower back. Back pain is associated with tingling and sharp pain that radiates down the back of her right buttock and thigh. Right knee is more painful than the left knee. There was no LOC.   Past Medical History  Diagnosis Date  . Back pain   . Chronic pelvic pain in female   . Neuromuscular disorder   . Anxiety     Past Surgical History  Procedure Date  . Cesarean section 2012    Womens  . Wisdom tooth extraction   . Laparoscopy 02/04/2012    Procedure: LAPAROSCOPY DIAGNOSTIC;  Surgeon: [name], MD;  Location: [facility];  Service: Gynecology;  Laterality: N/A;  diagnostic laparoscopy with incidental appendectomy  . Laparoscopic appendectomy 02/04/2012    Procedure: APPENDECTOMY LAPAROSCOPIC;  Surgeon: [name], MD;  Location: [facility];  Service: Gynecology;  Laterality: N/A;  . Appendectomy     Family History  Problem Relation Age of Onset  . Diabetes Father   . Hypertension Father   . Hypertension Sister   . Colon cancer Neg Hx   . Anesthesia problems Neg Hx   . Hypotension Neg Hx   . Malignant hyperthermia Neg Hx   . Pseudochol deficiency Neg Hx   . Stomach cancer Paternal Grandfather   . Cancer Paternal Grandfather     stomach cancer  . Lung cancer Other     both grandparents  . Cancer Mother   . Lupus Mother   . Cancer Maternal Grandmother     lung cancer  . Cancer Paternal Grandmother     lung cancer    History  Substance Use  Topics  . Smoking status: Former Smoker -- 0.5 packs/day for 3 years    Types: Cigarettes    Quit date: 01/02/2011  . Smokeless tobacco: Never Used  . Alcohol Use: No    OB History    Grav Para Term Preterm Abortions TAB SAB Ect Mult Living   2 1 1  1   1  1       Review of Systems  Constitutional: Negative for fever.       10 Systems reviewed and are negative for acute change except as noted in the HPI.  HENT: Negative for congestion.   Eyes: Negative for discharge and redness.  Respiratory: Negative for cough and shortness of breath.   Cardiovascular: Negative for chest pain.  Gastrointestinal: Negative for vomiting and abdominal pain.  Musculoskeletal: Negative for back pain.       Back pain, right and left knee pain  Skin: Negative for rash.  Neurological: Negative for syncope, numbness and headaches.  Psychiatric/Behavioral:       No behavior change.    Allergies  Review of patient's allergies indicates no known allergies.  Home Medications   Current Outpatient Rx  Name Route Sig Dispense Refill  . IBUPROFEN 200 MG PO TABS Oral Take 400 mg by mouth daily as needed. For back pain    .  TRAMADOL HCL 50 MG PO TABS Oral Take 50 mg by mouth every 8 (eight) hours.      BP 144/97  Pulse 110  Temp(Src) 98.8 F (37.1 C) (Oral)  Resp 20  Ht 5\' 2"  (1.575 m)  Wt 178 lb (80.74 kg)  BMI 32.56 kg/m2  SpO2 98%  LMP 03/05/2012  Physical Exam  Nursing note and vitals reviewed. Constitutional: She is oriented to person, place, and time.       Awake, alert, nontoxic appearance.  HENT:  Head: Normocephalic and atraumatic.  Right Ear: External ear normal.  Left Ear: External ear normal.  Mouth/Throat: Oropharynx is clear and moist.  Eyes: Pupils are equal, round, and reactive to light. Right eye exhibits no discharge. Left eye exhibits no discharge.  Neck: Normal range of motion. Neck supple.  Cardiovascular: Normal rate, normal heart sounds and intact distal pulses.     Pulmonary/Chest: Effort normal. She exhibits no tenderness.  Abdominal: Soft. There is no tenderness. There is no rebound.  Musculoskeletal: She exhibits no tenderness.       Baseline ROM, no obvious new focal weakness.No abrasions, bruising, swelling to right or left knee. FROM. Lower back with lumbar para spinal tenderness to palpation, right greater than left. Negative straight leg raise bilaterally.  Neurological: She is alert and oriented to person, place, and time. She has normal reflexes.       Mental status and motor strength appears baseline for patient and situation.  Skin: Skin is warm and dry. No rash noted.  Psychiatric: She has a normal mood and affect.    ED Course  Procedures (including critical care time)  Labs Reviewed  URINALYSIS, ROUTINE W REFLEX MICROSCOPIC - Abnormal; Notable for the following:    Color, Urine AMBER (*) BIOCHEMICALS MAY BE AFFECTED BY COLOR   Specific Gravity, Urine >1.030 (*)    Bilirubin Urine SMALL (*)    Ketones, ur 15 (*)    All other components within normal limits  PREGNANCY, URINE   Dg Lumbar Spine Complete  04/04/2012  *RADIOLOGY REPORT*  Clinical Data: Right knee pain and low back pain after fall while moving furniture down steps.  LUMBAR SPINE - COMPLETE 4+ VIEW  Comparison: Lumbar spine 08/12/2011.  MRI lumbar spine 09/23/2011.  Findings: Five lumbar type vertebra.  Lumbar vertebrae and facet joints demonstrate normal alignment.  No vertebral compression deformities.  Intervertebral disc space heights are preserved.  No focal bone lesion or bone destruction.  Bone cortex and trabecular architecture appear intact.  No significant change since previous study.  IMPRESSION: No displaced fractures identified.  Original Report Authenticated By: Marlon Pel, M.D.   Dg Knee Complete 4 Views Right  04/04/2012  *RADIOLOGY REPORT*  Clinical Data: Knee pain after fall.  RIGHT KNEE - COMPLETE 4+ VIEW  Comparison: None.  Findings: The right knee  appears intact. No evidence of acute fracture or subluxation.  No focal bone lesions.  Bone matrix and cortex appear intact.  No abnormal radiopaque densities in the soft tissues.  No significant effusion.  IMPRESSION: No acute bony abnormalities.  Original Report Authenticated By: Marlon Pel, M.D.        MDM  Patient with a fall and complaint of bilateral knee pain and lower back pain. X-rays do not show any fractures. Patient was given analgesics and anti-inflammatories. She was placed in a knee immobilizer for the right knee. Referral to orthopedics was made.Pt stable in ED with no significant deterioration in condition.The patient appears  reasonably screened and/or stabilized for discharge and I doubt any other medical condition or other [facility] requiring further screening, evaluation, or treatment in the ED at this time prior to discharge.  MDM Reviewed: nursing note and vitals Interpretation: x-ray           Nicoletta Dress. Colon Branch, MD 04/05/12 1093

## 2012-04-05 NOTE — Discharge Instructions (Signed)
Apply heat to those areas that are stiff, ice for swelling. Use the immobilizer for comfort. Continue to use your tramadol at home. Use a stronger pain medicine as needed. Followup with the orthopedist listed on the paperwork if the pain does not improve.

## 2013-04-15 ENCOUNTER — Encounter (HOSPITAL_COMMUNITY): Payer: Self-pay | Admitting: Emergency Medicine

## 2013-04-15 ENCOUNTER — Emergency Department (HOSPITAL_COMMUNITY): Payer: 59

## 2013-04-15 ENCOUNTER — Emergency Department (HOSPITAL_COMMUNITY)
Admission: EM | Admit: 2013-04-15 | Discharge: 2013-04-15 | Disposition: A | Discharge status: Home / Self Care | Payer: 59 | Attending: Emergency Medicine | Admitting: Emergency Medicine

## 2013-04-15 DIAGNOSIS — G8929 Other chronic pain: Secondary | ICD-10-CM

## 2013-04-15 DIAGNOSIS — Z8659 Personal history of other mental and behavioral disorders: Secondary | ICD-10-CM | POA: Insufficient documentation

## 2013-04-15 DIAGNOSIS — Z9089 Acquired absence of other organs: Secondary | ICD-10-CM | POA: Insufficient documentation

## 2013-04-15 DIAGNOSIS — R110 Nausea: Secondary | ICD-10-CM | POA: Insufficient documentation

## 2013-04-15 DIAGNOSIS — R109 Unspecified abdominal pain: Secondary | ICD-10-CM | POA: Insufficient documentation

## 2013-04-15 DIAGNOSIS — Z8739 Personal history of other diseases of the musculoskeletal system and connective tissue: Secondary | ICD-10-CM | POA: Insufficient documentation

## 2013-04-15 DIAGNOSIS — Z8669 Personal history of other diseases of the nervous system and sense organs: Secondary | ICD-10-CM | POA: Insufficient documentation

## 2013-04-15 DIAGNOSIS — R197 Diarrhea, unspecified: Secondary | ICD-10-CM | POA: Insufficient documentation

## 2013-04-15 DIAGNOSIS — Z79899 Other long term (current) drug therapy: Secondary | ICD-10-CM | POA: Insufficient documentation

## 2013-04-15 DIAGNOSIS — Z87891 Personal history of nicotine dependence: Secondary | ICD-10-CM | POA: Insufficient documentation

## 2013-04-15 LAB — CBC WITH DIFFERENTIAL/PLATELET
Eosinophils Relative: 1 % (ref 0–5)
HCT: 39.3 % (ref 36.0–46.0)
Lymphocytes Relative: 18 % (ref 12–46)
Lymphs Abs: 1.6 10*3/uL (ref 0.7–4.0)
MCV: 92.7 fL (ref 78.0–100.0)
Monocytes Absolute: 0.5 10*3/uL (ref 0.1–1.0)
Neutro Abs: 6.4 10*3/uL (ref 1.7–7.7)
RBC: 4.24 MIL/uL (ref 3.87–5.11)
WBC: 8.5 10*3/uL (ref 4.0–10.5)

## 2013-04-15 LAB — URINE MICROSCOPIC-ADD ON

## 2013-04-15 LAB — BASIC METABOLIC PANEL
CO2: 24 mEq/L (ref 19–32)
Calcium: 9.6 mg/dL (ref 8.4–10.5)
Chloride: 103 mEq/L (ref 96–112)
Creatinine, Ser: 0.65 mg/dL (ref 0.50–1.10)
Glucose, Bld: 97 mg/dL (ref 70–99)
Sodium: 137 mEq/L (ref 135–145)

## 2013-04-15 LAB — URINALYSIS, ROUTINE W REFLEX MICROSCOPIC
Bilirubin Urine: NEGATIVE
Glucose, UA: NEGATIVE mg/dL
Specific Gravity, Urine: 1.025 (ref 1.005–1.030)
pH: 6 (ref 5.0–8.0)

## 2013-04-15 MED ORDER — ONDANSETRON HCL 4 MG/2ML IJ SOLN
4.0000 mg | Freq: Once | INTRAMUSCULAR | Status: AC
  Administered 2013-04-15: 4 mg via INTRAVENOUS
  Filled 2013-04-15: qty 2

## 2013-04-15 MED ORDER — TRAMADOL HCL 50 MG PO TABS: 50.0000 mg | ORAL_TABLET | Freq: Four times a day (QID) | ORAL | Status: DC | PRN

## 2013-04-15 MED ORDER — HYDROMORPHONE HCL PF 1 MG/ML IJ SOLN
1.0000 mg | Freq: Once | INTRAMUSCULAR | Status: AC
  Administered 2013-04-15: 1 mg via INTRAVENOUS

## 2013-04-15 MED ORDER — IOHEXOL 300 MG/ML  SOLN
100.0000 mL | Freq: Once | INTRAMUSCULAR | Status: AC | PRN
  Administered 2013-04-15: 100 mL via INTRAVENOUS

## 2013-04-15 MED ORDER — IOHEXOL 300 MG/ML  SOLN
50.0000 mL | Freq: Once | INTRAMUSCULAR | Status: AC | PRN
  Administered 2013-04-15: 50 mL via ORAL

## 2013-04-15 MED ORDER — HYDROMORPHONE HCL PF 1 MG/ML IJ SOLN
1.0000 mg | Freq: Once | INTRAMUSCULAR | Status: DC
  Filled 2013-04-15: qty 1

## 2013-04-15 MED ORDER — HYDROMORPHONE HCL PF 1 MG/ML IJ SOLN
1.0000 mg | Freq: Once | INTRAMUSCULAR | Status: AC
  Administered 2013-04-15: 1 mg via INTRAVENOUS
  Filled 2013-04-15: qty 1

## 2013-04-15 NOTE — ED Provider Notes (Signed)
History    This chart was scribed for Suzi Roots, MD by Leone Payor, ED Scribe. This patient was seen in room APA09/APA09 and the patient's care was started 1:08 PM.   CSN: [id number]  Arrival date & time 04/15/13  1206   None     Chief Complaint  Patient presents with  . Abdominal Pain    The history is provided by the patient and a parent. No language interpreter was used.    HPI Comments: Debbie [name] is a 27 y.o. female who presents to the Emergency Department complaining of chronic abdominal pain for 2 years with the newest episode starting 2 weeks ago. States the pain is RLQ and near right lateral pelvis/iliac crest and comes and goes almost everyday. She describes the pain as generally dull with sharp and stabbing moments that last for 5 minutes at a time. Reports this is different from pain associated with menstruation. She has associated nausea and sl loose stool today.  She has h/o cesarean section and laparoscopic appendectomy in March 2013. No abd distension. Having normal bms. Nausea. No vomiting. No fever/chills. No hx kidney stones or gallstones. No trauma or strain to area. lnmp 2 weeks ago. No vaginal discharge or bleeding. Denies hx endometriosis, indicates has had laparascopy in past for same pain and was negative. No hx ovarian cysts or uterine fibroids.    Past Medical History  Diagnosis Date  . Back pain   . Chronic pelvic pain in female   . Neuromuscular disorder   . Anxiety     Past Surgical History  Procedure Laterality Date  . Cesarean section  2012    Womens  . Wisdom tooth extraction    . Laparoscopy  02/04/2012    Procedure: LAPAROSCOPY DIAGNOSTIC;  Surgeon: [name], MD;  Location: [facility];  Service: Gynecology;  Laterality: N/A;  diagnostic laparoscopy with incidental appendectomy  . Laparoscopic appendectomy  02/04/2012    Procedure: APPENDECTOMY LAPAROSCOPIC;  Surgeon: [name], MD;  Location: [facility];  Service: Gynecology;   Laterality: N/A;  . Appendectomy      Family History  Problem Relation Age of Onset  . Diabetes Father   . Hypertension Father   . Hypertension Sister   . Colon cancer Neg Hx   . Anesthesia problems Neg Hx   . Hypotension Neg Hx   . Malignant hyperthermia Neg Hx   . Pseudochol deficiency Neg Hx   . Stomach cancer Paternal Grandfather   . Cancer Paternal Grandfather     stomach cancer  . Lung cancer Other     both grandparents  . Cancer Mother   . Lupus Mother   . Cancer Maternal Grandmother     lung cancer  . Cancer Paternal Grandmother     lung cancer    History  Substance Use Topics  . Smoking status: Former Smoker -- 0.50 packs/day for 3 years    Types: Cigarettes    Quit date: 01/02/2011  . Smokeless tobacco: Never Used  . Alcohol Use: No    OB History   Grav Para Term Preterm Abortions TAB SAB Ect Mult Living   2 1 1  1   1  1       Review of Systems  Constitutional: Negative for fever.  HENT: Negative for neck pain.   Eyes: Negative for redness.  Respiratory: Negative for cough.   Cardiovascular: Negative for chest pain.  Gastrointestinal: Positive for nausea, abdominal pain and diarrhea. Negative  for vomiting, constipation and blood in stool.  Genitourinary: Negative for dysuria and flank pain.  Musculoskeletal: Negative for back pain.  Skin: Negative for rash.  Neurological: Negative for headaches.  Psychiatric/Behavioral: Negative for confusion.  All other systems reviewed and are negative.    Allergies  Review of patient's allergies indicates no known allergies.  Home Medications   Current Outpatient Rx  Name  Route  Sig  Dispense  Refill  . ibuprofen (ADVIL,MOTRIN) 200 MG tablet   Oral   Take 400 mg by mouth daily as needed. For back pain         . traMADol (ULTRAM) 50 MG tablet   Oral   Take 50 mg by mouth every 8 (eight) hours.           BP 154/104  Pulse 93  Temp(Src) 98.5 F (36.9 C) (Oral)  Resp 18  Ht 5\' 2"  (1.575 m)   Wt 180 lb (81.647 kg)  BMI 32.91 kg/m2  SpO2 100%  LMP 03/22/2013  Physical Exam  Nursing note and vitals reviewed. Constitutional: She is oriented to person, place, and time. She appears well-developed and well-nourished. No distress.  HENT:  Head: Normocephalic and atraumatic.  Eyes: Conjunctivae and EOM are normal. No scleral icterus.  Neck: Neck supple. No tracheal deviation present.  Cardiovascular: Normal rate, regular rhythm and normal heart sounds.   Pulmonary/Chest: Effort normal and breath sounds normal. No respiratory distress.  Abdominal: Soft. Bowel sounds are normal. She exhibits no distension and no mass. There is no rebound and no guarding.  Right mid abd tenderness. No hernia.   Genitourinary:  No cva tenderness  Musculoskeletal: Normal range of motion.  Neurological: She is alert and oriented to person, place, and time.  Skin: Skin is warm and dry. No rash noted.  Psychiatric: She has a normal mood and affect. Her behavior is normal.    ED Course  Procedures (including critical care time)  DIAGNOSTIC STUDIES: Oxygen Saturation is 100% on room air, normal by my interpretation.    COORDINATION OF CARE: 2:07 PM Discussed treatment plan with pt at bedside and pt agreed to plan.    Results for orders placed during the hospital encounter of 04/15/13  URINALYSIS, ROUTINE W REFLEX MICROSCOPIC      Result Value Range   Color, Urine YELLOW  YELLOW   APPearance CLEAR  CLEAR   Specific Gravity, Urine 1.025  1.005 - 1.030   pH 6.0  5.0 - 8.0   Glucose, UA NEGATIVE  NEGATIVE mg/dL   Hgb urine dipstick TRACE (*) NEGATIVE   Bilirubin Urine NEGATIVE  NEGATIVE   Ketones, ur NEGATIVE  NEGATIVE mg/dL   Protein, ur NEGATIVE  NEGATIVE mg/dL   Urobilinogen, UA 0.2  0.0 - 1.0 mg/dL   Nitrite NEGATIVE  NEGATIVE   Leukocytes, UA NEGATIVE  NEGATIVE  PREGNANCY, URINE      Result Value Range   Preg Test, Ur NEGATIVE  NEGATIVE  CBC WITH DIFFERENTIAL      Result Value Range    WBC 8.5  4.0 - 10.5 K/uL   RBC 4.24  3.87 - 5.11 MIL/uL   Hemoglobin 13.8  12.0 - 15.0 g/dL   HCT 16.1  09.6 - 04.5 %   MCV 92.7  78.0 - 100.0 fL   MCH 32.5  26.0 - 34.0 pg   MCHC 35.1  30.0 - 36.0 g/dL   RDW 40.9  81.1 - 91.4 %   Platelets 291  150 - 400 K/uL  Neutrophils Relative % 75  43 - 77 %   Neutro Abs 6.4  1.7 - 7.7 K/uL   Lymphocytes Relative 18  12 - 46 %   Lymphs Abs 1.6  0.7 - 4.0 K/uL   Monocytes Relative 6  3 - 12 %   Monocytes Absolute 0.5  0.1 - 1.0 K/uL   Eosinophils Relative 1  0 - 5 %   Eosinophils Absolute 0.1  0.0 - 0.7 K/uL   Basophils Relative 0  0 - 1 %   Basophils Absolute 0.0  0.0 - 0.1 K/uL  BASIC METABOLIC PANEL      Result Value Range   Sodium 137  135 - 145 mEq/L   Potassium 4.0  3.5 - 5.1 mEq/L   Chloride 103  96 - 112 mEq/L   CO2 24  19 - 32 mEq/L   Glucose, Bld 97  70 - 99 mg/dL   BUN 9  6 - 23 mg/dL   Creatinine, Ser 0.98  0.50 - 1.10 mg/dL   Calcium 9.6  8.4 - 11.9 mg/dL   GFR calc non Af Amer >90  >90 mL/min   GFR calc Af Amer >90  >90 mL/min  URINE MICROSCOPIC-ADD ON      Result Value Range   Squamous Epithelial / LPF MANY (*) RARE   WBC, UA 3-6  <3 WBC/hpf   RBC / HPF 3-6  <3 RBC/hpf   Bacteria, UA FEW (*) RARE   Ct Abdomen Pelvis W Contrast  04/15/2013   *RADIOLOGY REPORT*  Clinical Data: Right-sided abdominal pain for 3 years, getting worse in the last 3 weeks.  Prior appendectomy and C-section.  CT ABDOMEN AND PELVIS WITH CONTRAST  Technique:  Multidetector CT imaging of the abdomen and pelvis was performed following the standard protocol during bolus administration of intravenous contrast.  Contrast: 50mL OMNIPAQUE IOHEXOL 300 MG/ML  SOLN, OMNIPAQUE IOHEXOL 300 MG/ML  SOLN  Comparison: 09/11/2012  Findings: Images of the lung bases are unremarkable.  No focal abnormality identified within the liver, spleen, pancreas, adrenal glands, or kidneys.  The gallbladder is present.  The stomach and small bowel loops have a normal  appearance.  The appendix is surgically absent.  Colonic loops are normal in caliber and wall thickness.  The uterus is present.  No adnexal mass.  There is a small amount free pelvic fluid. No retroperitoneal or mesenteric adenopathy. No evidence for aortic aneurysm. Visualized osseous structures have a normal appearance.  IMPRESSION:  1.  No evidence for acute abnormality of the abdomen or pelvis. 2.  Prior appendectomy. 3.  Small amount free pelvic fluid is likely physiologic.   Original Report Authenticated By: Norva Pavlov, M.D.      MDM  I personally performed the services described in this documentation, which was scribed in my presence. The recorded information has been reviewed and is accurate.  Iv ns. Dilaudid 1 mg iv. zofran iv.  Labs. Pt/mom request ct.  Reviewed nursing notes and prior charts for additional history.   Ct neg acute.  Recheck abd soft nt.  Pt w chronic abd pain x 2 yrs, extensive eval in prior yrs, todays ct neg acute. Recheck abd soft nt. Afeb. Pt comfortable.  Pt appears stable for d/c.   Suzi Roots, MD 04/15/13 [phone]

## 2013-04-15 NOTE — ED Notes (Signed)
Ongoing abd pain x 2 years since last childbirth. Pt c/o severe abd pain x 2 weeks RLQ area and points to hip bone. Sharp pains. Pt tearful. Nausea. 4 loose stools today, denies black or bloody.

## 2013-04-18 LAB — URINE CULTURE

## 2013-04-18 NOTE — ED Notes (Signed)
Post ED Visit - Positive Culture Follow-up  Culture report reviewed by antimicrobial stewardship pharmacist: []  Wes Dulaney, Pharm.D., BCPS []  Celedonio Miyamoto, [address].D., BCPS []  Georgina Pillion, Pharm.D., BCPS [x]  [place], [address].D., BCPS, AAHIVP []  Estella Husk, Pharm.D., BCPS, AAHIV  Positive urine culture  no further patient follow-up is required at this time.  Larena Sox 04/18/2013, 5:09 PM

## 2014-09-23 ENCOUNTER — Encounter (HOSPITAL_COMMUNITY): Payer: Self-pay | Admitting: Emergency Medicine

## 2015-04-12 ENCOUNTER — Ambulatory Visit (INDEPENDENT_AMBULATORY_CARE_PROVIDER_SITE_OTHER): Payer: 59 | Admitting: Family Medicine

## 2015-04-12 VITALS — BP 142/88 | HR 126 | Temp 99.5°F | Ht 61.0 in | Wt 196.8 lb

## 2015-04-12 DIAGNOSIS — G894 Chronic pain syndrome: Secondary | ICD-10-CM | POA: Diagnosis not present

## 2015-04-12 DIAGNOSIS — F1123 Opioid dependence with withdrawal: Secondary | ICD-10-CM

## 2015-04-12 DIAGNOSIS — F1193 Opioid use, unspecified with withdrawal: Secondary | ICD-10-CM

## 2015-04-12 DIAGNOSIS — Z00129 Encounter for routine child health examination without abnormal findings: Secondary | ICD-10-CM

## 2015-04-12 MED ORDER — OXYCODONE-ACETAMINOPHEN 5-325 MG PO TABS: 1.0000 | ORAL_TABLET | Freq: Three times a day (TID) | ORAL | Status: DC | PRN

## 2015-04-12 MED ORDER — ALPRAZOLAM 0.5 MG PO TABS: 0.5000 mg | ORAL_TABLET | Freq: Two times a day (BID) | ORAL | Status: DC

## 2015-04-12 NOTE — Progress Notes (Addendum)
° °  Subjective:  This chart was scribed for [name], MD by Broadus Johnawaa Al Rifaie, Medical Scribe. This patient was seen in Room 10 and the patient's care was started at 10:51 AM.   Patient ID: Debbie [name], female    DOB: 02-Oct-1986, 29 y.o.   MRN: [id number]  HPI HPI Comments: Debbie CrumbKristIn N Bolick is a 29 y.o. female with PMHx of low back pain, chronic right SI joint pain, pelvic pain, and bilateral knee pain; who presents to [facility] complaining of generalized body ache for about a week now. She describes the pain as being extreme, and it feels like "electricity" radiating through her body. She indicates having diarrhea.   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 However, she has been out of the oxycodone for about a week now. She does not believe that she can cope with the pain anymore. She states that she has been struggling to stay active as a result of her pain.  Patient shows concern about her PCP Dr. Thurmond ButtsKpeglo, who is currently [place], not always being available to tend to her problems, and she expresses the need to find a PCP who can actually help her. She tried taking lexapro in 2007 for depression, however she did not find the relief that she needed.  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    Review of Systems  Constitutional: Positive for diaphoresis.  Gastrointestinal: Positive for diarrhea.  Musculoskeletal: Positive for myalgias and arthralgias.       Chronic  Psychiatric/Behavioral: The patient is nervous/anxious.       Objective:   Physical Exam  Constitutional: She is oriented to person, place, and time. She appears well-developed and well-nourished. No distress.  HENT:  Head: Normocephalic and atraumatic.  Mouth/Throat: Oropharynx is clear and moist. No  oropharyngeal exudate.  Eyes: Pupils are equal, round, and reactive to light.  Neck: Neck supple.  Cardiovascular:  tachycardia  Pulmonary/Chest: Effort normal.  Musculoskeletal: She exhibits no edema.  Neurological: She is alert and oriented to person, place, and time. No cranial nerve deficit.  Skin: Skin is warm and dry. No rash noted.  Psychiatric: She has a normal mood and affect. Her behavior is normal.  Nursing note and vitals reviewed.  patient is diaphoretic with hyperactive bowel sounds    Assessment & Plan:   This chart was scribed in my presence and reviewed by me personally.    ICD-9-CM ICD-10-CM   1. Chronic pain syndrome 338.4 G89.4 ALPRAZolam (XANAX) 0.5 MG tablet     oxyCODONE-acetaminophen (ROXICET) 5-325 MG per tablet  2. Encounter for routine preventive care for patient older than 28 days V20.2 Z00.129 Ambulatory referral to Internal Medicine  3. 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  Signed, [name], MD

## 2015-04-12 NOTE — Patient Instructions (Signed)
Your going to need some therapy. 2 people that I think are excellent Drs Lowella FairyKen Fraser and Shanon RosserBarbara Farran  I'm referring you to [facility] for primary care  I'm asking her to take no more than 15 mg of your oxycodone and 81 24-hour period and giving permission to taper this gradually over the month.

## 2015-05-09 ENCOUNTER — Encounter: Payer: Self-pay | Admitting: Podiatry

## 2015-05-09 ENCOUNTER — Ambulatory Visit (INDEPENDENT_AMBULATORY_CARE_PROVIDER_SITE_OTHER): Payer: 59 | Admitting: Podiatry

## 2015-05-09 VITALS — BP 88/54 | HR 78 | Resp 18

## 2015-05-09 DIAGNOSIS — M205X1 Other deformities of toe(s) (acquired), right foot: Secondary | ICD-10-CM | POA: Diagnosis not present

## 2015-05-09 DIAGNOSIS — R52 Pain, unspecified: Secondary | ICD-10-CM

## 2015-05-09 DIAGNOSIS — M722 Plantar fascial fibromatosis: Secondary | ICD-10-CM | POA: Diagnosis not present

## 2015-05-09 MED ORDER — MELOXICAM 15 MG PO TABS: 15.0000 mg | ORAL_TABLET | Freq: Every day | ORAL | Status: DC

## 2015-05-09 NOTE — Progress Notes (Signed)
   Subjective:    Patient ID: Debbie [name], female    DOB: 1986-09-03, 29 y.o.   MRN: [id number]  HPI I HAVE SOME LEFT HEEL PAIN AND HAS BEEN GOING ON FOR ABOUT A MONTH AND IS SORE AND TENDER AND HURTS IF IAM SITTING OR UP ON IT A LOT AND SHOOTING PAIN AND ICE PICK FEELING AND MY RIGHT BIG TOE AND IS NUMB AND TINGLING AND HAS BEEN GOING ON FOR ABOUT 2 YEARS This patient presents to my office with two complaints.  She says she experiences sharp stabbing pain in her left heel  She says that upon rising in the AM she has severe pain left heel.  Pain also occurs upon rising from sitting position. Her pain has been worseinng through the last month despite rest ice and stretching and motrin.  She also relates numbness and tingling on the inside of her big toe right foot.  No injury or trauma noted.   Review of Systems  All other systems reviewed and are negative.      Objective:   Physical Exam @BARCODE2D (Error - No data available.)@  Objective: Review of past medical history, medications, social history and allergies were performed.  Vascular: Dorsalis pedis and posterior tibial pulses were palpable B/L, capillary refill was  WNL B/L, temperature gradient was WNL B/L   Skin:  No signs of symptoms of infection or ulcers on both feet  Nails: appear healthy with no signs of mycosis or infections  Sensory: Semmes Weinstein monifilament WNL   Orthopedic: Orthopedic evaluation demonstrates all joints distal t ankle have full ROM without crepitus, muscle power WNL B/L.  Palpable pain at insertion plantar fascia left heel.  Pain also present through course of plantar fascia left heel.  She has hallux limitus 1st MPJ right foot.  Bony exostosis dorsally first metatarsal right foot.      Assessment & Plan:  Plantar fasciitis left heel.  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   IE.  Xray 2 views.  Injection therapy left heel.  Purestrides recommended and dispensed.  Mobic prescribed.  Discussed  footgear.  Functional hallux limitus noted.

## 2015-05-23 ENCOUNTER — Ambulatory Visit: Payer: 59 | Admitting: Podiatry

## 2015-07-14 ENCOUNTER — Other Ambulatory Visit: Payer: Self-pay | Admitting: Podiatry

## 2015-07-14 NOTE — Telephone Encounter (Signed)
Pt must be evaluated prior to future refills. 

## 2015-10-20 ENCOUNTER — Other Ambulatory Visit: Payer: Self-pay

## 2016-01-09 ENCOUNTER — Encounter: Payer: Self-pay | Admitting: Adult Health

## 2016-01-09 ENCOUNTER — Other Ambulatory Visit (HOSPITAL_COMMUNITY)
Admission: RE | Admit: 2016-01-09 | Discharge: 2016-01-09 | Disposition: A | Discharge status: Home / Self Care | Payer: 59 | Source: Ambulatory Visit | Attending: Obstetrics and Gynecology | Admitting: Obstetrics and Gynecology

## 2016-01-09 ENCOUNTER — Ambulatory Visit (INDEPENDENT_AMBULATORY_CARE_PROVIDER_SITE_OTHER): Payer: 59 | Admitting: Adult Health

## 2016-01-09 VITALS — BP 130/92 | HR 84 | Ht 61.0 in | Wt 204.0 lb

## 2016-01-09 DIAGNOSIS — Z01419 Encounter for gynecological examination (general) (routine) without abnormal findings: Secondary | ICD-10-CM

## 2016-01-09 DIAGNOSIS — L739 Follicular disorder, unspecified: Secondary | ICD-10-CM | POA: Insufficient documentation

## 2016-01-09 DIAGNOSIS — Z01411 Encounter for gynecological examination (general) (routine) with abnormal findings: Secondary | ICD-10-CM | POA: Diagnosis not present

## 2016-01-09 HISTORY — DX: Follicular disorder, unspecified: L73.9

## 2016-01-09 NOTE — Progress Notes (Signed)
Patient ID: Debbie [name], female   DOB: Jul 03, 1986, 30 y.o.   MRN: [id number] History of Present Illness: Debbie [name] is a 30 year old white female, G2P1 in for well woman gyn exam and pap. PCP is Dr Zachery Dauer at [place], had labs with him.  Current Medications, Allergies, Past Medical History, Past Surgical History, Family History and Social History were reviewed in [employer] record.     Review of Systems: Patient denies any headaches, hearing loss, fatigue, blurred vision, shortness of breath, chest pain, abdominal pain, problems with bowel movements, urination, or intercourse. No joint pain or mood swings.She has history of chronic pain in joints and abdomin but is using oils and vitamins and is better, she does complain of boils on thighs at times. Not using birth control, has 29 year old.She works at [employer].   Physical Exam:BP 130/92 mmHg  Pulse 84  Ht  (1.549 m)  Wt 204 lb (92.534 kg)  BMI 38.57 kg/m2  LMP 12/19/2015 (Approximate) General:  Well developed, well nourished, no acute distress Skin:  Warm and dry Neck:  Midline trachea, normal thyroid, good ROM, no lymphadenopathy Lungs; Clear to auscultation bilaterally Breast:  No dominant palpable mass, retraction, or nipple discharge Cardiovascular: Regular rate and rhythm Abdomen:  Soft, non tender, no hepatosplenomegaly Pelvic:  External genitalia is normal in appearance, no lesions. Has skin tag base left labia. The vagina is normal in appearance. Urethra has no lesions or masses. The cervix is smooth, pap performed.  Uterus is felt to be normal size, shape, and contour.  No adnexal masses or tenderness noted.Bladder is non tender, no masses felt. She has dark hair and folliculitis in groin area that is healing Extremities/musculoskeletal:  No swelling or varicosities noted, no clubbing or cyanosis Psych:  No mood changes, alert and cooperative,seems happy Discussed using bar soaps and not shaving, see  dermatologist.Take folic acid 800 mcg daily.  Impression: Well woman gyn exam and pap Folliculitis    Plan: Take folic acid Physical in 1 year, pap in 2-3 if normal See dermatologist Labs with PCP

## 2016-01-09 NOTE — Patient Instructions (Signed)
Take folic acid  Physical in 1 year,pap in 2-3 years Labs with PCP See dermatologist

## 2016-01-13 LAB — CYTOLOGY - PAP

## 2016-04-27 ENCOUNTER — Ambulatory Visit (INDEPENDENT_AMBULATORY_CARE_PROVIDER_SITE_OTHER): Payer: 59 | Admitting: Adult Health

## 2016-04-27 ENCOUNTER — Encounter: Payer: Self-pay | Admitting: Adult Health

## 2016-04-27 VITALS — BP 110/70 | HR 104 | Ht 62.0 in | Wt 206.0 lb

## 2016-04-27 DIAGNOSIS — N644 Mastodynia: Secondary | ICD-10-CM | POA: Diagnosis not present

## 2016-04-27 DIAGNOSIS — N63 Unspecified lump in breast: Secondary | ICD-10-CM

## 2016-04-27 DIAGNOSIS — N6310 Unspecified lump in the right breast, unspecified quadrant: Secondary | ICD-10-CM

## 2016-04-27 HISTORY — DX: Unspecified lump in the right breast, unspecified quadrant: N63.10

## 2016-04-27 HISTORY — DX: Mastodynia: N64.4

## 2016-04-27 NOTE — Progress Notes (Signed)
Subjective:     Patient ID: Debbie [name], female   DOB: 03-30-1986, 30 y.o.   MRN: [id number]  HPI Debbie [name] is a 30 year old white female in complaining of breast tenderness and pressure for several months now.  Review of Systems + breast tenderness, and pressure Reviewed past medical,surgical, social and family history. Reviewed medications and allergies.     Objective:   Physical Exam BP 110/70 mmHg  Pulse 104  Ht 5\' 2"  (1.575 m)  Wt 206 lb (93.441 kg)  BMI 37.67 kg/m2  LMP 04/16/2016  Skin warm and dry,  Breasts:no dominate palpable mass, retraction or nipple discharge on left, on right has no retraction or nipple discharge but has pea sized mass at 12 o'clock 4 fb from areola in area of regular irregularities and is tender.Will get US.    Assessment:     Right breast mass Breast tenderness    Plan:     [facility]10 [facility] be there at 4 pm Do not wear under wire Follow up prn

## 2016-04-27 NOTE — Patient Instructions (Signed)
[facility] at 4;10 pm be there at 4 pm  Follow up prn

## 2016-04-28 ENCOUNTER — Other Ambulatory Visit (HOSPITAL_COMMUNITY): Payer: Self-pay | Admitting: Pulmonary Disease

## 2016-04-28 DIAGNOSIS — IMO0002 Reserved for concepts with insufficient information to code with codable children: Secondary | ICD-10-CM

## 2016-04-28 DIAGNOSIS — R229 Localized swelling, mass and lump, unspecified: Principal | ICD-10-CM

## 2016-05-04 ENCOUNTER — Other Ambulatory Visit (HOSPITAL_COMMUNITY): Payer: 59

## 2016-05-04 ENCOUNTER — Ambulatory Visit (HOSPITAL_COMMUNITY)
Admission: RE | Admit: 2016-05-04 | Discharge: 2016-05-04 | Disposition: A | Discharge status: Home / Self Care | Payer: 59 | Source: Ambulatory Visit | Attending: Adult Health | Admitting: Adult Health

## 2016-05-04 DIAGNOSIS — N644 Mastodynia: Secondary | ICD-10-CM | POA: Diagnosis not present

## 2016-05-04 DIAGNOSIS — N63 Unspecified lump in breast: Secondary | ICD-10-CM | POA: Diagnosis not present

## 2016-05-04 DIAGNOSIS — N6310 Unspecified lump in the right breast, unspecified quadrant: Secondary | ICD-10-CM

## 2017-01-13 ENCOUNTER — Encounter: Payer: Self-pay | Admitting: Adult Health

## 2017-01-13 ENCOUNTER — Ambulatory Visit (INDEPENDENT_AMBULATORY_CARE_PROVIDER_SITE_OTHER): Payer: 59 | Admitting: Adult Health

## 2017-01-13 VITALS — BP 140/80 | HR 113 | Ht 60.5 in | Wt 197.5 lb

## 2017-01-13 DIAGNOSIS — L732 Hidradenitis suppurativa: Secondary | ICD-10-CM | POA: Diagnosis not present

## 2017-01-13 DIAGNOSIS — Z01411 Encounter for gynecological examination (general) (routine) with abnormal findings: Secondary | ICD-10-CM | POA: Diagnosis not present

## 2017-01-13 DIAGNOSIS — Z01419 Encounter for gynecological examination (general) (routine) without abnormal findings: Secondary | ICD-10-CM

## 2017-01-13 DIAGNOSIS — L0292 Furuncle, unspecified: Secondary | ICD-10-CM | POA: Diagnosis not present

## 2017-01-13 MED ORDER — SULFAMETHOXAZOLE-TRIMETHOPRIM 800-160 MG PO TABS: 1.0000 | ORAL_TABLET | Freq: Two times a day (BID) | ORAL | 1 refills | Status: DC

## 2017-01-13 NOTE — Progress Notes (Signed)
Patient ID: Debbie CrumbKristIn N [name], female   DOB: 1986/05/17, 31 y.o.   MRN: [id number] History of Present Illness: Debbie [name] is a 31 year old white female in for well woman gyn exam, she had normal pap 01/09/16. PCP Dr [place]achery [place]Barnes in [place].  Current Medications, Allergies, Past Medical History, Past Surgical History, Family History and Social History were reviewed in [facility] electronic medical record.     Review of Systems: Patient denies any headaches, hearing loss, fatigue, blurred vision, shortness of breath, chest pain, abdominal pain, problems with bowel movements, urination, or intercourse(not currently having sex). No joint pain or mood swings.Has +boil.     Physical Exam:BP 140/80 (BP Location: Left Arm, Patient Position: Sitting, Cuff Size: Normal)   Pulse (!) 113   Ht 5' 0.5" (1.537 m)   Wt 197 lb 8 oz (89.6 kg)   LMP 01/07/2017   BMI 37.94 kg/m  General:  Well developed, well nourished, no acute distress Skin:  Warm and dry Neck:  Midline trachea, normal thyroid, good ROM, no lymphadenopathy Lungs; Clear to auscultation bilaterally Breast:  No dominant palpable mass, retraction, or nipple discharge Cardiovascular: Regular rate and rhythm Abdomen:  Soft, non tender, no hepatosplenomegaly Pelvic:  External genitalia is normal in appearance, has scarring from hidradenitis and resolving boil mons pubis. The vagina is normal in appearance. Urethra has no lesions or masses. The cervix is bulbous.  Uterus is felt to be normal size, shape, and contour.  No adnexal masses or tenderness noted.Bladder is non tender, no masses felt. Extremities/musculoskeletal:  No swelling or varicosities noted, no clubbing or cyanosis Psych:  No mood changes, alert and cooperative,seems happy PHQ 2 score 0.  Impression:  1. Well woman exam with routine gynecological exam   2. Hidradenitis   3. Boil      Plan: Physical in 1 year Pap in 2020 Rx septra ds 1 bid x 14 days #28 with 1  refill Use dial antibacterial soap Can try boil Eze if boil returns

## 2017-02-07 ENCOUNTER — Telehealth: Payer: Self-pay | Admitting: Obstetrics & Gynecology

## 2017-02-07 NOTE — Telephone Encounter (Signed)
Spoke with patient who states the boil which she had at her recent visit is still present and is now "darker and bigger". She is also concerned that she may have gotten bitten by a spider a couple of weeks ago on her bottom. The area is red, burns and itches. She has another refill on her Bactrim available but wants to know if she should go ahead and take it or be seen again. She thinks you may have mentioned a culture at her last visit. Please advise.

## 2017-02-07 NOTE — Telephone Encounter (Signed)
Pt would like to speak to someone regarding an antibiotic prescribed by Victorino DikeJennifer (for boil/cyst in groin area). Victorino DikeJennifer put a refill on med in case. Pt wondering if she needs to take second round of abx.

## 2017-02-07 NOTE — Telephone Encounter (Signed)
She says she has ?recurrent boil, go ahead and start septra ds and come in for appt Wednesday morning at 8:30 am

## 2017-02-09 ENCOUNTER — Ambulatory Visit (INDEPENDENT_AMBULATORY_CARE_PROVIDER_SITE_OTHER): Payer: 59 | Admitting: Adult Health

## 2017-02-09 ENCOUNTER — Encounter: Payer: Self-pay | Admitting: Adult Health

## 2017-02-09 VITALS — BP 130/74 | HR 102 | Ht 62.0 in | Wt 194.0 lb

## 2017-02-09 DIAGNOSIS — L0292 Furuncle, unspecified: Secondary | ICD-10-CM | POA: Diagnosis not present

## 2017-02-09 DIAGNOSIS — B029 Zoster without complications: Secondary | ICD-10-CM

## 2017-02-09 MED ORDER — VALACYCLOVIR HCL 1 G PO TABS: 1000.0000 mg | ORAL_TABLET | Freq: Two times a day (BID) | ORAL | 1 refills | Status: DC

## 2017-02-09 MED ORDER — SILVER SULFADIAZINE 1 % EX CREA: 1.0000 "application " | TOPICAL_CREAM | Freq: Two times a day (BID) | CUTANEOUS | 0 refills | Status: DC

## 2017-02-09 NOTE — Progress Notes (Signed)
Subjective:     Patient ID: Debbie [name], female   DOB: 1986-01-03, 31 y.o.   MRN: [id number]  HPI Debbie [name] is a 31 year old white female, back in follow up of boil on mons pubis and ?bug bite right buttock, it itches and burns, has had for almost 2 weeks   Review of Systems +boil ?bug bite, has itching and burning right buttock  Reviewed past medical,surgical, social and family history. Reviewed medications and allergies.     Objective:   Physical Exam BP 130/74 (BP Location: Left Arm, Patient Position: Sitting, Cuff Size: Normal)   Pulse (!) 102   Ht 5\' 2"  (1.575 m)   Wt 194 lb (88 kg)   LMP 02/06/2017 (Approximate)   BMI 35.48 kg/m    Skin warm and dry, boil in mons pubis, not red, still tender about 2 cm now, and has area right buttock of vesicles, that are drying, looks like shingles.  Assessment:     1. Boil   2. Herpes zoster without complication       Plan:     Meds ordered this encounter  Medications  . silver sulfADIAZINE (SILVADENE) 1 % cream    Sig: Apply 1 application topically 2 (two) times daily.    Dispense:  25 g    Refill:  0    Order Specific Question:   Supervising Provider    Answer:   EURE, LUTHER H [[id number]]  . valACYclovir (VALTREX) 1000 MG tablet    Sig: Take 1 tablet (1,000 mg total) by mouth 2 (two) times daily.    Dispense:  20 tablet    Refill:  1    Order Specific Question:   Supervising Provider    Answer:   Duane LopeEURE, LUTHER H [[id number]]  Continue septra ds Try boil eze Follow up prn

## 2017-05-16 ENCOUNTER — Emergency Department (HOSPITAL_COMMUNITY): Payer: 59

## 2017-05-16 ENCOUNTER — Emergency Department (HOSPITAL_COMMUNITY)
Admission: EM | Admit: 2017-05-16 | Discharge: 2017-05-16 | Disposition: A | Discharge status: Home / Self Care | Payer: 59 | Attending: Emergency Medicine | Admitting: Emergency Medicine

## 2017-05-16 ENCOUNTER — Encounter (HOSPITAL_COMMUNITY): Payer: Self-pay | Admitting: Emergency Medicine

## 2017-05-16 DIAGNOSIS — R51 Headache: Secondary | ICD-10-CM | POA: Insufficient documentation

## 2017-05-16 DIAGNOSIS — Z87891 Personal history of nicotine dependence: Secondary | ICD-10-CM | POA: Diagnosis not present

## 2017-05-16 DIAGNOSIS — R519 Headache, unspecified: Secondary | ICD-10-CM

## 2017-05-16 MED ORDER — DIPHENHYDRAMINE HCL 50 MG/ML IJ SOLN
25.0000 mg | Freq: Once | INTRAMUSCULAR | Status: AC
  Administered 2017-05-16: 25 mg via INTRAVENOUS
  Filled 2017-05-16: qty 1

## 2017-05-16 MED ORDER — METOCLOPRAMIDE HCL 5 MG/ML IJ SOLN
10.0000 mg | Freq: Once | INTRAMUSCULAR | Status: AC
Start: 2017-05-16 — End: 2017-05-16
  Administered 2017-05-16: 10 mg via INTRAVENOUS
  Filled 2017-05-16: qty 2

## 2017-05-16 MED ORDER — SODIUM CHLORIDE 0.9 % IV BOLUS (SEPSIS)
1000.0000 mL | Freq: Once | INTRAVENOUS | Status: AC
  Administered 2017-05-16: 1000 mL via INTRAVENOUS

## 2017-05-16 MED ORDER — KETOROLAC TROMETHAMINE 30 MG/ML IJ SOLN
30.0000 mg | Freq: Once | INTRAMUSCULAR | Status: AC
  Administered 2017-05-16: 30 mg via INTRAVENOUS
  Filled 2017-05-16: qty 1

## 2017-05-16 NOTE — ED Provider Notes (Signed)
[facility] Provider Note   CSN: [id number] Arrival date & time: 05/16/17  1802     History   Chief Complaint Chief Complaint  Patient presents with  . Headache    HPI Debbie [name] is a 31 y.o. female.  Patient complains of occipital headache after exercising 2 nights ago and sensing a "popping noise".  Pain improves with rest and worsened with exertion. She has tried ibuprofen with minimal relief. No gross neurological deficits. No fever, sweats, chills, meningeal signs.      Past Medical History:  Diagnosis Date  . Anxiety   . Arthritis   . Back pain   . Breast mass, right 04/27/2016  . Breast tenderness 04/27/2016  . Chronic pelvic pain in female   . Fibromyalgia   . Folliculitis 01/09/2016  . Mental disorder   . Neuromuscular disorder [facility])     Patient Active Problem List   Diagnosis Date Noted  . Breast mass, right 04/27/2016  . Breast tenderness 04/27/2016  . Folliculitis 01/09/2016  . Knee pain, bilateral 02/14/2012  . Chronic pelvic pain in female 02/04/2012  . RLQ abdominal pain 01/12/2012  . Lumbar radiculopathy 09/29/2011  . Low back pain 09/29/2011  . Chronic right SI joint pain 09/29/2011    Past Surgical History:  Procedure Laterality Date  . APPENDECTOMY    . CESAREAN SECTION  2012   [facility]om[facility]  . LAPAROSCOPIC APPENDECTOMY  02/04/2012   Procedure: APPENDECTOMY LAPAROSCOPIC;  Surgeon: [name], MD;  Location: [facility];  Service: Gynecology;  Laterality: N/A;  . LAPAROSCOPY  02/04/2012   Procedure: LAPAROSCOPY DIAGNOSTIC;  Surgeon: [name], MD;  Location: [facility];  Service: Gynecology;  Laterality: N/A;  diagnostic laparoscopy with incidental appendectomy  . WISDOM TOOTH EXTRACTION      OB History    Gravida Para Term Preterm AB Living   2 1 1   1 1    SAB TAB Ectopic Multiple Live Births       1   1       Home Medications    Prior to Admission medications   Medication Sig Start Date End Date Taking? Authorizing  Provider  b complex vitamins capsule Take 1 capsule by mouth daily.     [provider]  Cholecalciferol (VITAMIN D-3 PO) Take by mouth daily.    [provider]  CINNAMON PO Take by mouth daily.    [provider]  MAGNESIUM PO Take by mouth daily.    [provider]  Omega-3 Fatty Acids (FISH OIL) 1000 MG CAPS Take by mouth daily.     [provider]  POTASSIUM GLUCONATE PO Take by mouth daily.    [provider]  silver sulfADIAZINE (SILVADENE) 1 % cream Apply 1 application topically 2 (two) times daily. 02/09/17   Adline Potter, NP  sulfamethoxazole-trimethoprim (BACTRIM DS,SEPTRA DS) 800-160 MG tablet Take 1 tablet by mouth 2 (two) times daily. 01/13/17   Adline Potter, NP  TURMERIC PO Take by mouth daily.    [provider]  valACYclovir (VALTREX) 1000 MG tablet Take 1 tablet (1,000 mg total) by mouth 2 (two) times daily. 02/09/17   Adline Potter, NP    Family History Family History  Problem Relation Age of Onset  . Diabetes Father   . Hypertension Father   . Stomach cancer Paternal Grandfather   . Cancer Paternal Grandfather        stomach cancer  . Cancer Mother   .  Lupus Mother   . Cancer Maternal Grandmother        lung cancer  . Cancer Paternal Grandmother        lung cancer  . Eczema Son   . Hypertension Sister   . Lung cancer Other        both grandparents  . Colon cancer Neg Hx   . Anesthesia problems Neg Hx   . Hypotension Neg Hx   . Malignant hyperthermia Neg Hx   . Pseudochol deficiency Neg Hx     Social History Social History  Substance Use Topics  . Smoking status: Former Smoker    Packs/day: 0.00    Years: 3.00    Types: Cigarettes    Quit date: 01/02/2011  . Smokeless tobacco: Never Used  . Alcohol use No     Allergies   Amoxicillin   Review of Systems Review of Systems  All other systems reviewed and are negative.    Physical Exam Updated Vital Signs BP (!)  139/97   Pulse (!) 107   Temp 99.2 F (37.3 C) (Oral)   Resp 18   Ht 5\' 1"  (1.549 m)   Wt 84.8 kg (187 lb)   LMP 04/22/2017   SpO2 100%   BMI 35.33 kg/m   Physical Exam  Constitutional: She is oriented to person, place, and time. She appears well-developed and well-nourished.  HENT:  Head: Normocephalic and atraumatic.  Eyes: Conjunctivae are normal.  Neck: Neck supple.  Cardiovascular: Normal rate and regular rhythm.   Pulmonary/Chest: Effort normal and breath sounds normal.  Abdominal: Soft. Bowel sounds are normal.  Musculoskeletal: Normal range of motion.  Neurological: She is alert and oriented to person, place, and time.  Skin: Skin is warm and dry.  Psychiatric: She has a normal mood and affect. Her behavior is normal.  Nursing note and vitals reviewed.    ED Treatments / Results  Labs (all labs ordered are listed, but only abnormal results are displayed) Labs Reviewed - No data to display  EKG  EKG Interpretation None       Radiology Ct Head Wo Contrast  Result Date: 05/16/2017 CLINICAL DATA:  31 year old female with occipital headache for 2 days. EXAM: CT HEAD WITHOUT CONTRAST TECHNIQUE: Contiguous axial images were obtained from the base of the skull through the vertex without intravenous contrast. COMPARISON:  None. FINDINGS: Brain: No evidence of infarction, hemorrhage, hydrocephalus, extra-axial collection or mass lesion/mass effect. Vascular: No hyperdense vessel or unexpected calcification. Skull: Normal. Negative for fracture or focal lesion. Sinuses/Orbits: No acute finding. Other: None. IMPRESSION: Unremarkable noncontrast head CT. Electronically Signed   By: Harmon PierJeffrey  Hu M.D.   On: 05/16/2017 20:41    Procedures Procedures (including critical care time)  Medications Ordered in ED Medications  sodium chloride 0.9 % bolus 1,000 mL (0 mLs Intravenous Stopped 05/16/17 2108)  ketorolac (TORADOL) 30 MG/ML injection 30 mg (30 mg Intravenous Given 05/16/17  2013)  diphenhydrAMINE (BENADRYL) injection 25 mg (25 mg Intravenous Given 05/16/17 2008)  metoCLOPramide (REGLAN) injection 10 mg (10 mg Intravenous Given 05/16/17 2011)     Initial Impression / Assessment and Plan / ED Course  I have reviewed the triage vital signs and the nursing notes.  Pertinent labs & imaging results that were available during my care of the patient were reviewed by me and considered in my medical decision making (see chart for details).     Physical exam was normal. CT head negative. Patient feels much better after IV fluids,  IV Toradol, IV Benadryl, IV Reglan.  Final Clinical Impressions(s) / ED Diagnoses   Final diagnoses:  Acute intractable headache, unspecified headache type    New Prescriptions New Prescriptions   No medications on file     Donnetta Hutching, MD 05/23/17 1424

## 2017-05-16 NOTE — Discharge Instructions (Signed)
CT scan was normal. Follow-up your primary care doctor.

## 2017-05-16 NOTE — ED Triage Notes (Signed)
Patient states she was working out Saturday and felt a "pop" and had pain to back of head. States pain is better with rest but comes back with any exertion.

## 2017-07-27 ENCOUNTER — Ambulatory Visit: Payer: 59 | Admitting: Adult Health

## 2017-07-27 ENCOUNTER — Ambulatory Visit (INDEPENDENT_AMBULATORY_CARE_PROVIDER_SITE_OTHER): Payer: 59 | Admitting: Adult Health

## 2017-07-27 ENCOUNTER — Encounter: Payer: Self-pay | Admitting: Adult Health

## 2017-07-27 VITALS — BP 120/84 | HR 97 | Ht 61.0 in | Wt 176.0 lb

## 2017-07-27 DIAGNOSIS — B360 Pityriasis versicolor: Secondary | ICD-10-CM | POA: Diagnosis not present

## 2017-07-27 DIAGNOSIS — Z3009 Encounter for other general counseling and advice on contraception: Secondary | ICD-10-CM

## 2017-07-27 DIAGNOSIS — B029 Zoster without complications: Secondary | ICD-10-CM | POA: Diagnosis not present

## 2017-07-27 MED ORDER — HYDROCODONE-ACETAMINOPHEN 5-325 MG PO TABS: 1.0000 | ORAL_TABLET | Freq: Four times a day (QID) | ORAL | 0 refills | Status: DC | PRN

## 2017-07-27 MED ORDER — VALACYCLOVIR HCL 1 G PO TABS: 1000.0000 mg | ORAL_TABLET | Freq: Two times a day (BID) | ORAL | 3 refills | Status: DC

## 2017-07-27 NOTE — Progress Notes (Signed)
Subjective:     Patient ID: Debbie CrumbKristin N [name], female   DOB: [birth year], 31 y.o.   MRN: [id number]  HPI Belenda CruiseKristin is a 31 year old white female in complaining of pain right buttock ?shingles and wants to discuss birth control, thinking IUD.  Review of Systems Pain right buttock and ? Shingles Hair on chin  Skin color changes on chest Reviewed past medical,surgical, social and family history. Reviewed medications and allergies.     Objective:   Physical Exam BP 120/84 (BP Location: Right Arm, Patient Position: Sitting, Cuff Size: Normal)   Pulse 97   Ht 5\' 1"  (1.549 m)   Wt 176 lb (79.8 kg)   BMI 33.25 kg/m Has area of blisters right buttock, has had shingles in past.Skin warm and dry and has patches of lighter coloring, on chest and increased hair on chin. Discussed IUD and have handout for her review.     Assessment:     1. Herpes zoster without complication   2. Tinea versicolor   3. Encounter for education about contraceptive use       Plan:     Meds ordered this encounter  Medications  . valACYclovir (VALTREX) 1000 MG tablet    Sig: Take 1 tablet (1,000 mg total) by mouth 2 (two) times daily.    Dispense:  20 tablet    Refill:  3    Order Specific Question:   Supervising Provider    Answer:   Despina HiddenEURE, LUTHER H [[id number]]  . HYDROcodone-acetaminophen (NORCO/VICODIN) 5-325 MG tablet    Sig: Take 1 tablet by mouth every 6 (six) hours as needed.    Dispense:  30 tablet    Refill:  0    Order Specific Question:   Supervising Provider    Answer:   Lazaro ArmsEURE, LUTHER H [[id number]]  Review handout on mirena Will get [name] to check insurance on IUD Try selsun shampoo, if not better see dermatologist  F/U prn

## 2017-08-02 ENCOUNTER — Ambulatory Visit: Payer: 59 | Admitting: Adult Health

## 2017-08-11 ENCOUNTER — Ambulatory Visit (INDEPENDENT_AMBULATORY_CARE_PROVIDER_SITE_OTHER): Payer: 59 | Admitting: Advanced Practice Midwife

## 2017-08-11 ENCOUNTER — Encounter: Payer: Self-pay | Admitting: Advanced Practice Midwife

## 2017-08-11 VITALS — BP 182/94 | HR 108 | Ht 61.0 in | Wt 175.0 lb

## 2017-08-11 DIAGNOSIS — Z30430 Encounter for insertion of intrauterine contraceptive device: Secondary | ICD-10-CM

## 2017-08-11 DIAGNOSIS — Z3202 Encounter for pregnancy test, result negative: Secondary | ICD-10-CM | POA: Diagnosis not present

## 2017-08-11 LAB — POCT URINE PREGNANCY: PREG TEST UR: NEGATIVE

## 2017-08-11 MED ORDER — LEVONORGESTREL 20 MCG/24HR IU IUD
INTRAUTERINE_SYSTEM | Freq: Once | INTRAUTERINE | Status: AC
  Administered 2017-08-11: 16:00:00 via INTRAUTERINE

## 2017-08-11 NOTE — Progress Notes (Signed)
Debbie [name] is a 31 y.o. year old  female   who presents for placement of a Mirena IUD. Her LMP was 9/19 and her pregnancy test today is negative.    The risks and benefits of the method and placement have been thouroughly reviewed with the patient and all questions were answered.  Specifically the patient is aware of failure rate of 11/998, expulsion of the IUD and of possible perforation.  The patient is aware of irregular bleeding due to the method and understands the incidence of irregular bleeding diminishes with time.  Time out was performed.  A Graves speculum was placed.  The cervix was prepped using Betadine. The uterus was found to be neutral and it sounded to 8 cm.  The cervix was grasped with a tenaculum and the IUD was inserted to 8 cm.  It was pulled back 1 cm and the IUD was disengaged.  The strings were trimmed to 3 cm.  Sonogram was performed and the proper placement of the IUD was verified.  The patient was instructed on signs and symptoms of infection and to check for the strings after each menses or each month.  The patient is to refrain from intercourse for 3 days.  The patient is scheduled for a return appointment after her first menses or 4 weeks.  CRESENZO-DISHMAN,Zyere Jiminez 08/11/2017 3:45 PM

## 2017-08-18 ENCOUNTER — Ambulatory Visit: Payer: 59 | Admitting: Adult Health

## 2017-08-18 ENCOUNTER — Telehealth: Payer: Self-pay | Admitting: *Deleted

## 2017-08-18 NOTE — Telephone Encounter (Signed)
Patient states she has not been able to feel her IUD strings since it was placed. She has small hands and has tried to feel for them while sitting on the toilet but has not had any luck. Has not had intercourse and is only having mild cramps as if her period were coming, no "bending over" pain. Informed patient that it could be her strings were trimmed a little shorter. Advised to try laying down and seeing if she can feel them and also to get her partner to see if they notice them either. Offered patient sooner appt for string check but patient declined and stated she would call back if needed.

## 2017-09-08 ENCOUNTER — Encounter: Payer: Self-pay | Admitting: Advanced Practice Midwife

## 2017-09-08 ENCOUNTER — Ambulatory Visit (INDEPENDENT_AMBULATORY_CARE_PROVIDER_SITE_OTHER): Payer: 59 | Admitting: Advanced Practice Midwife

## 2017-09-08 VITALS — BP 140/100 | HR 102 | Wt 174.0 lb

## 2017-09-08 DIAGNOSIS — Z30431 Encounter for routine checking of intrauterine contraceptive device: Secondary | ICD-10-CM | POA: Diagnosis not present

## 2017-09-08 NOTE — Progress Notes (Signed)
History:  31 y.o. [id number] here today for today for IUD string check; Mirena IUD was placed  9/20. No complaints about the Mirena, no concerning side effects.hasn't been able to get to cervix  The following portions of the patient's history were reviewed and updated as appropriate: allergies, current medications, past family history, past medical history, past social history, past surgical history and problem list.  Review of Systems:   Constitutional: Negative for fever and chills Eyes: Negative for visual disturbances Respiratory: Negative for shortness of breath, dyspnea Cardiovascular: Negative for chest pain or palpitations  Gastrointestinal: Negative for vomiting, diarrhea and constipation Genitourinary: Negative for dysuria and urgency Musculoskeletal: Negative for back pain, joint pain, myalgias  Neurological: Negative for dizziness and headaches    Objective:  Physical Exam Blood pressure (!) 140/100, pulse (!) 102, weight 174 lb (78.9 kg). Gen: NAD Abd: Soft, nontender and nondistended SSE:  Strings wrapped around cx at 9 o'clock Pelvic: Bedside US reveals properly place IUD.   Assessment & Plan:  Normal IUD check. Patient to keep IUD in place for five years; can come in for removal if she desires pregnancy within the next five years. Routine preventative health maintenance measures emphasized.

## 2017-10-10 ENCOUNTER — Telehealth: Payer: Self-pay | Admitting: Adult Health

## 2017-10-10 NOTE — Telephone Encounter (Signed)
Spoke with pt. Pt started having itching and burning Friday where shingles have been before. More intense Saturday. Blisters are starting to form. Started Valtrex yesterday. Pt wonders if there is anything she can do to stop getting this. Please advise. Thanks!! JSY

## 2017-10-10 NOTE — Telephone Encounter (Signed)
Pt has outbreak and has started valtrex

## 2017-11-28 ENCOUNTER — Encounter (HOSPITAL_COMMUNITY): Payer: Self-pay

## 2017-11-28 ENCOUNTER — Emergency Department (HOSPITAL_COMMUNITY)
Admission: EM | Admit: 2017-11-28 | Discharge: 2017-11-28 | Disposition: A | Discharge status: Home / Self Care | Payer: 59 | Attending: Emergency Medicine | Admitting: Emergency Medicine

## 2017-11-28 ENCOUNTER — Emergency Department (HOSPITAL_COMMUNITY): Payer: 59

## 2017-11-28 DIAGNOSIS — N200 Calculus of kidney: Secondary | ICD-10-CM

## 2017-11-28 DIAGNOSIS — Z79899 Other long term (current) drug therapy: Secondary | ICD-10-CM | POA: Insufficient documentation

## 2017-11-28 DIAGNOSIS — N23 Unspecified renal colic: Secondary | ICD-10-CM | POA: Insufficient documentation

## 2017-11-28 DIAGNOSIS — R109 Unspecified abdominal pain: Secondary | ICD-10-CM | POA: Diagnosis present

## 2017-11-28 DIAGNOSIS — Z87891 Personal history of nicotine dependence: Secondary | ICD-10-CM | POA: Insufficient documentation

## 2017-11-28 LAB — CBC
HCT: 40.6 % (ref 36.0–46.0)
Hemoglobin: 13.7 g/dL (ref 12.0–15.0)
MCH: 33.5 pg (ref 26.0–34.0)
MCHC: 33.7 g/dL (ref 30.0–36.0)
MCV: 99.3 fL (ref 78.0–100.0)
Platelets: 313 10*3/uL (ref 150–400)
RBC: 4.09 MIL/uL (ref 3.87–5.11)
RDW: 12.6 % (ref 11.5–15.5)
WBC: 7.4 10*3/uL (ref 4.0–10.5)

## 2017-11-28 LAB — BASIC METABOLIC PANEL
Anion gap: 15 (ref 5–15)
BUN: 23 mg/dL — ABNORMAL HIGH (ref 6–20)
CALCIUM: 9.5 mg/dL (ref 8.9–10.3)
CHLORIDE: 101 mmol/L (ref 101–111)
CO2: 20 mmol/L — AB (ref 22–32)
CREATININE: 0.85 mg/dL (ref 0.44–1.00)
GFR calc non Af Amer: >60 mL/min (ref >60)
GLUCOSE: 138 mg/dL — AB (ref 65–99)
Potassium: 3.5 mmol/L (ref 3.5–5.1)
Sodium: 136 mmol/L (ref 135–145)

## 2017-11-28 LAB — URINALYSIS, ROUTINE W REFLEX MICROSCOPIC
BILIRUBIN URINE: NEGATIVE
Bacteria, UA: NONE SEEN
GLUCOSE, UA: NEGATIVE mg/dL
KETONES UR: 20 mg/dL — AB
LEUKOCYTES UA: NEGATIVE
NITRITE: NEGATIVE
Protein, ur: NEGATIVE mg/dL
Specific Gravity, Urine: 1.02 (ref 1.005–1.030)
pH: 5 (ref 5.0–8.0)

## 2017-11-28 LAB — PREGNANCY, URINE: Preg Test, Ur: NEGATIVE

## 2017-11-28 MED ORDER — ONDANSETRON HCL 4 MG/2ML IJ SOLN
4.0000 mg | Freq: Once | INTRAMUSCULAR | Status: AC
  Administered 2017-11-28: 4 mg via INTRAVENOUS
  Filled 2017-11-28: qty 2

## 2017-11-28 MED ORDER — OXYCODONE-ACETAMINOPHEN 5-325 MG PO TABS: 1.0000 | ORAL_TABLET | Freq: Four times a day (QID) | ORAL | 0 refills | Status: DC | PRN

## 2017-11-28 MED ORDER — HYDROMORPHONE HCL 1 MG/ML IJ SOLN
1.0000 mg | Freq: Once | INTRAMUSCULAR | Status: AC
  Administered 2017-11-28: 1 mg via INTRAVENOUS
  Filled 2017-11-28: qty 1

## 2017-11-28 MED ORDER — SODIUM CHLORIDE 0.9 % IV BOLUS (SEPSIS)
1000.0000 mL | Freq: Once | INTRAVENOUS | Status: AC
  Administered 2017-11-28: 1000 mL via INTRAVENOUS

## 2017-11-28 MED ORDER — PROCHLORPERAZINE EDISYLATE 5 MG/ML IJ SOLN
5.0000 mg | Freq: Once | INTRAMUSCULAR | Status: AC
  Administered 2017-11-28: 5 mg via INTRAVENOUS
  Filled 2017-11-28: qty 2

## 2017-11-28 MED ORDER — PROMETHAZINE HCL 25 MG RE SUPP: 25.0000 mg | Freq: Four times a day (QID) | RECTAL | 0 refills | Status: DC | PRN

## 2017-11-28 MED ORDER — HYDROMORPHONE HCL 1 MG/ML IJ SOLN
0.5000 mg | Freq: Once | INTRAMUSCULAR | Status: AC
  Administered 2017-11-28: 0.5 mg via INTRAVENOUS
  Filled 2017-11-28: qty 1

## 2017-11-28 MED ORDER — KETOROLAC TROMETHAMINE 30 MG/ML IJ SOLN
30.0000 mg | Freq: Once | INTRAMUSCULAR | Status: AC
  Administered 2017-11-28: 30 mg via INTRAVENOUS
  Filled 2017-11-28: qty 1

## 2017-11-28 MED ORDER — SODIUM CHLORIDE 0.9 % IV SOLN: 1000.0000 mL | INTRAVENOUS | Status: DC

## 2017-11-28 MED ORDER — MELOXICAM 15 MG PO TABS: 15.0000 mg | ORAL_TABLET | Freq: Every day | ORAL | 0 refills | Status: DC

## 2017-11-28 MED ORDER — IOPAMIDOL (ISOVUE-300) INJECTION 61%
100.0000 mL | Freq: Once | INTRAVENOUS | Status: AC | PRN
  Administered 2017-11-28: 100 mL via INTRAVENOUS

## 2017-11-28 NOTE — ED Triage Notes (Signed)
Pt reports waking up approx 0630 and had a pain left flank area. Pain has increased and is radiating to left abdomen. No N, V, D

## 2017-11-28 NOTE — Discharge Instructions (Signed)
Your CT scan suggest a 4 mm stone in your left ureter.  It is a short distance away from falling into your bladder.  Please strain all urine.  Please call and make an appointment with [facility].  Please use Mobic daily with food.  Use Percocet if needed for severe pain.  Please increase water and lemonade.  Please return to the emergency department immediately if any changes, problems, or concerns.

## 2017-11-28 NOTE — ED Provider Notes (Signed)
[facility] EMERGENCY DEPARTMENT Provider Note   CSN: [id number] Arrival date & time: 11/28/17  [id number]     History   Chief Complaint Chief Complaint  Patient presents with  . Flank Pain    HPI Debbie [name] is a 32 y.o. female.  Patient is a 32 year old female who presents to the emergency department with a complaint of left flank area pain.  The patient states she was in her usual state of health until approximately 630 this morning.  She awakened with pain that she thought was excess gas on her left side.  She went to the bathroom, but this did not relieve her discomfort.  She continued to get ready for work, the pain got worse.  She went to try to purchase some Tums or related medicine, but she noted continued increase in the level of pain, and had to cautiously drive here to the emergency department.  Patient states that this is 1 of the worst pains she is ever had, it is worse than labor pains when she was having her child.  Patient has a history of fibromyalgia, chronic pelvic pain, back pain, arthritis, and anxiety.  No recent surgeries or procedures involving the pelvis, flank, or back.  No recent fever or chills to be reported.  No nausea, but no vomiting and no diarrhea.  Is been no recent changes in the stools.  No recent changes in her urine.  She presents now for assistance with this issue.   The history is provided by the patient.  Flank Pain  This is a new problem. Pertinent negatives include no chest pain, no abdominal pain and no shortness of breath.    Past Medical History:  Diagnosis Date  . Anxiety   . Arthritis   . Back pain   . Breast mass, right 04/27/2016  . Breast tenderness 04/27/2016  . Chronic pelvic pain in female   . Fibromyalgia   . Folliculitis 01/09/2016  . Herpes simplex virus (HSV) infection    shingles  . Mental disorder   . Neuromuscular disorder (HCC)   . Shingles     Patient Active Problem List   Diagnosis Date Noted  . Breast  mass, right 04/27/2016  . Breast tenderness 04/27/2016  . Folliculitis 01/09/2016  . Knee pain, bilateral 02/14/2012  . Chronic pelvic pain in female 02/04/2012  . RLQ abdominal pain 01/12/2012  . Lumbar radiculopathy 09/29/2011  . Low back pain 09/29/2011  . Chronic right SI joint pain 09/29/2011    Past Surgical History:  Procedure Laterality Date  . APPENDECTOMY    . CESAREAN SECTION  2012   Womens  . LAPAROSCOPIC APPENDECTOMY  02/04/2012   Procedure: APPENDECTOMY LAPAROSCOPIC;  Surgeon: Tilda BurrowJohn V Ferguson, MD;  Location: [facility];  Service: Gynecology;  Laterality: N/A;  . LAPAROSCOPY  02/04/2012   Procedure: LAPAROSCOPY DIAGNOSTIC;  Surgeon: Tilda BurrowJohn V Ferguson, MD;  Location: [facility];  Service: Gynecology;  Laterality: N/A;  diagnostic laparoscopy with incidental appendectomy  . WISDOM TOOTH EXTRACTION      OB History    Gravida Para Term Preterm AB Living   2 1 1   1 1    SAB TAB Ectopic Multiple Live Births       1   1       Home Medications    Prior to Admission medications   Medication Sig Start Date End Date Taking? Authorizing Provider  b complex vitamins capsule Take 1 capsule by mouth daily.  [provider]  Cholecalciferol (VITAMIN D-3 PO) Take by mouth daily.    [provider]  CINNAMON PO Take by mouth daily.    [provider]  MAGNESIUM PO Take by mouth daily.    [provider]  Omega-3 Fatty Acids (FISH OIL) 1000 MG CAPS Take by mouth daily.     [provider]  POTASSIUM GLUCONATE PO Take by mouth daily.    [provider]  TURMERIC PO Take by mouth daily.    [provider]  valACYclovir (VALTREX) 1000 MG tablet Take 1 tablet (1,000 mg total) by mouth 2 (two) times daily. Patient not taking: Reported on 09/08/2017 07/27/17   Adline Potter, NP    Family History Family History  Problem Relation Age of Onset  . Diabetes Father   . Hypertension Father   . Stomach cancer Paternal  Grandfather   . Cancer Paternal Grandfather        stomach cancer  . Cancer Mother   . Lupus Mother   . Cancer Maternal Grandmother        lung cancer  . Cancer Paternal Grandmother        lung cancer  . Eczema Son   . Hypertension Sister   . Lung cancer Other        both grandparents  . Colon cancer Neg Hx   . Anesthesia problems Neg Hx   . Hypotension Neg Hx   . Malignant hyperthermia Neg Hx   . Pseudochol deficiency Neg Hx     Social History Social History   Tobacco Use  . Smoking status: Former Smoker    Packs/day: 0.00    Years: 3.00    Pack years: 0.00    Types: Cigarettes    Last attempt to quit: 01/02/2011    Years since quitting: 6.9  . Smokeless tobacco: Never Used  Substance Use Topics  . Alcohol use: No  . Drug use: No     Allergies   Amoxicillin   Review of Systems Review of Systems  Constitutional: Negative for activity change.       All ROS Neg except as noted in HPI  HENT: Negative for nosebleeds.   Eyes: Negative for photophobia and discharge.  Respiratory: Negative for cough, shortness of breath and wheezing.   Cardiovascular: Negative for chest pain and palpitations.  Gastrointestinal: Positive for nausea and vomiting. Negative for abdominal pain and blood in stool.  Genitourinary: Positive for flank pain. Negative for dysuria, frequency and hematuria.  Musculoskeletal: Negative for arthralgias, back pain and neck pain.  Skin: Negative.   Neurological: Negative for dizziness, seizures and speech difficulty.  Psychiatric/Behavioral: Negative for confusion and hallucinations.     Physical Exam Updated Vital Signs BP 137/79 (BP Location: Right Arm)   Pulse 80   Temp 97.8 F (36.6 C) (Oral)   Resp (!) 24   Wt 77.1 kg (170 lb)   SpO2 96%   BMI 32.12 kg/m   Physical Exam  Constitutional: She is oriented to person, place, and time. She appears well-developed and well-nourished.  Non-toxic appearance.  HENT:  Head: Normocephalic.    Right Ear: Tympanic membrane and external ear normal.  Left Ear: Tympanic membrane and external ear normal.  Eyes: EOM and lids are normal. Pupils are equal, round, and reactive to light.  Neck: Normal range of motion. Neck supple. Carotid bruit is not present.  Cardiovascular: Normal rate, regular rhythm, normal heart sounds, intact distal pulses and normal pulses.  Pulmonary/Chest: Breath sounds normal. No respiratory distress.  Abdominal: Soft. Bowel sounds are normal. There is no tenderness. There is no guarding.  Abdomen is soft with good bowel sounds.  Patient has pain in the left flank area with change of position.  Mild left CVA tenderness.  Musculoskeletal: Normal range of motion.  Lymphadenopathy:       Head (right side): No submandibular adenopathy present.       Head (left side): No submandibular adenopathy present.    She has no cervical adenopathy.  Neurological: She is alert and oriented to person, place, and time. She has normal strength. No cranial nerve deficit or sensory deficit.  Skin: Skin is warm and dry.  Psychiatric: She has a normal mood and affect. Her speech is normal.  Nursing note and vitals reviewed.    ED Treatments / Results  Labs (all labs ordered are listed, but only abnormal results are displayed) Labs Reviewed  URINALYSIS, ROUTINE W REFLEX MICROSCOPIC  PREGNANCY, URINE  BASIC METABOLIC PANEL  CBC    EKG  EKG Interpretation None       Radiology No results found.  Procedures Procedures (including critical care time)  Medications Ordered in ED Medications - No data to display   Initial Impression / Assessment and Plan / ED Course  I have reviewed the triage vital signs and the nursing notes.  Pertinent labs & imaging results that were available during my care of the patient were reviewed by me and considered in my medical decision making (see chart for details).       Final Clinical Impressions(s) / ED Diagnoses MDM Vital  signs within normal limits.  Pulse oximetry is 96% on room air.  Within normal limits by my interpretation.  Patient is rolling in the bed complaining of pain and moaning.  Patient was given 0.5 mg of Dilaudid as well as an anti-medic.  Patient received no relief from this medication.  An additional dose of IV Dilaudid given to the patient.  Pregnancy test is negative.  Will obtain a CT scan of the abdomen and pelvis.  9:11am - Pt having 2 bout with vomiting. IV compazine ordered.  Recheck.  Patient more comfortable after second dose of medication and IV nausea medicine.  The glucose is slightly elevated on the basic metabolic panel at 138, the BUN is elevated at 23,  And the CO2 is just slightly low at 20, otherwise within normal limits.  The complete blood count is well within normal limits.  The urinalysis shows too many to count red blood cells there is a moderate amount of hemoglobin on the dipstick.  There is 20 mg/dL of ketones present.  A CT scan with contrast was obtained to evaluate for possible kidney stone, abscess, changes related to IUD, or other possible sources of abdominal pain. CT abdomen shows a mild to moderate left hydronephrosis due to a 0.4 cm stone at the left UVJ.  The CT is otherwise within normal limits.  I discussed the findings on the examination as well as the findings on the x-ray with the patient.  Questions were answered.  Patient will be treated with Mobic daily, prescription for Percocet 1 every 6 hours given for more severe pain.  I have provided a strainer for the patient to strain all urine.  Patient is referred to urology for first time stone evaluation.  Patient is in agreement with this plan.    Final diagnoses:  Ureteral colic  Kidney stone on left side  ED Discharge Orders        Ordered    meloxicam (MOBIC) 15 MG tablet  Daily     11/28/17 1218    oxyCODONE-acetaminophen (PERCOCET/ROXICET) 5-325 MG tablet  Every 6 hours PRN     11/28/17 1218     promethazine (PHENERGAN) 25 MG suppository  Every 6 hours PRN     11/28/17 1218       Ivery Quale, PA-C 11/28/17 1244    Doug Sou, MD 11/28/17 1542

## 2017-12-26 ENCOUNTER — Telehealth: Payer: Self-pay | Admitting: *Deleted

## 2017-12-26 MED ORDER — METRONIDAZOLE 500 MG PO TABS: 500.0000 mg | ORAL_TABLET | Freq: Two times a day (BID) | ORAL | 0 refills | Status: DC

## 2017-12-26 NOTE — Telephone Encounter (Signed)
Pt has odor and feels like it is BV requesting flagyl, will rx flagyl 500 mg 1 bid x 7 days, no alcohol or sex during treatment, if not better, will need appt.

## 2018-02-13 ENCOUNTER — Other Ambulatory Visit: Payer: Self-pay | Admitting: Adult Health

## 2018-06-07 ENCOUNTER — Other Ambulatory Visit: Payer: Self-pay | Admitting: Women's Health

## 2018-11-07 ENCOUNTER — Other Ambulatory Visit: Payer: Self-pay | Admitting: Women's Health

## 2018-12-22 ENCOUNTER — Ambulatory Visit (INDEPENDENT_AMBULATORY_CARE_PROVIDER_SITE_OTHER): Payer: 59 | Admitting: Obstetrics and Gynecology

## 2018-12-22 ENCOUNTER — Encounter: Payer: Self-pay | Admitting: Obstetrics and Gynecology

## 2018-12-22 VITALS — BP 133/88 | HR 96 | Ht 61.0 in | Wt 171.6 lb

## 2018-12-22 DIAGNOSIS — R109 Unspecified abdominal pain: Secondary | ICD-10-CM | POA: Diagnosis not present

## 2018-12-22 DIAGNOSIS — L680 Hirsutism: Secondary | ICD-10-CM

## 2018-12-22 MED ORDER — TRAMADOL HCL 50 MG PO TABS: 50.0000 mg | ORAL_TABLET | Freq: Four times a day (QID) | ORAL | 0 refills | Status: DC | PRN

## 2018-12-22 NOTE — Addendum Note (Signed)
Addended by: [name] on: 12/22/2018 12:24 PM   Modules accepted: Orders

## 2018-12-22 NOTE — Progress Notes (Addendum)
Patient ID: Debbie [name], female   DOB: 1985/11/26, 33 y.o.   MRN: [id number]    [facility] Clinic Visit  @DATE @            Patient name: Debbie [name] MRN [id number]  Date of birth: December 02, 1985  CC & HPI:  Debbie [name] is a 33 y.o. female presenting today for pain in right abd area. Pain has happened around 1 week, took some ibuprofen to help with pain. Pressure and pain feels like it would explode if she coughs, laughs, stands for long periods of time at work. Took today off pain is causing her exhaustion. Pain doesn't wake her up. No pain in her left side. She can't sleep on side or stomach, sleeping on her back alleviates pain in right side.  Feels pain in hipbone that radiates to back, feels like ice pick in her side. Mainly feels fullness and pressure. Has backed off from heavy lifting at the gym to reduce pain. After exercising roughly an hour afterwards pain returned. There was no pain during exercising.  Has IUD in place to stop dysmenorrhea go away. Has had a lot of recent hair growth under chin.  Fam Hx:Mom had total hysterectomy at 22 years old. Has had appendix removed in 2012.   Divorced in 2018  ROS:  ROS +chronic right abd pain -dysmenorrhea -fever  All systems are negative except as noted in the HPI and PMH.   Pertinent History Reviewed:   Reviewed:  Medical         Past Medical History:  Diagnosis Date  . Anxiety   . Arthritis   . Back pain   . Breast mass, right 04/27/2016  . Breast tenderness 04/27/2016  . Chronic pelvic pain in female   . Fibromyalgia   . Folliculitis 01/09/2016  . Herpes simplex virus (HSV) infection    shingles  . Mental disorder   . Neuromuscular disorder (HCC)   . Shingles                               Surgical Hx:    Past Surgical History:  Procedure Laterality Date  . APPENDECTOMY    . CESAREAN SECTION  2012   Womens  . LAPAROSCOPIC APPENDECTOMY  02/04/2012   Procedure: APPENDECTOMY LAPAROSCOPIC;  Surgeon: [name], MD;  Location: [facility];  Service: Gynecology;  Laterality: N/A;  . LAPAROSCOPY  02/04/2012   Procedure: LAPAROSCOPY DIAGNOSTIC;  Surgeon: [name], MD;  Location: [facility];  Service: Gynecology;  Laterality: N/A;  diagnostic laparoscopy with incidental appendectomy  . WISDOM TOOTH EXTRACTION     Medications: Reviewed & Updated - see associated section                       Current Outpatient Medications:  .  acetaminophen (TYLENOL) 500 MG tablet, Take 1,000 mg by mouth as needed., Disp: , Rfl:  .  b complex vitamins capsule, Take 1 capsule by mouth daily. , Disp: , Rfl:  .  Cholecalciferol (VITAMIN D-3 PO), Take by mouth daily., Disp: , Rfl:  .  ibuprofen (ADVIL,MOTRIN) 200 MG tablet, Take 400-600 mg by mouth as needed., Disp: , Rfl:  .  MAGNESIUM PO, Take by mouth daily., Disp: , Rfl:  .  Omega-3 Fatty Acids (FISH OIL) 1000 MG CAPS, Take by mouth daily. , Disp: , Rfl:  .  TURMERIC PO, Take  by mouth daily., Disp: , Rfl:  .  valACYclovir (VALTREX) 1000 MG tablet, TAKE 1 TABLET BY MOUTH TWICE DAILY, Disp: 20 tablet, Rfl: 0   Social History: Reviewed -  reports that she quit smoking about 7 years ago. Her smoking use included cigarettes. She smoked 0.00 packs per day for 3.00 years. She has never used smokeless tobacco.  Objective Findings:  Vitals: Blood pressure (!) 169/104, pulse (!) 102, height 5\' 1"  (1.549 m), weight 171 lb 9.6 oz (77.8 kg).  PHYSICAL EXAMINATION General appearance - alert, well appearing, and in no distress Mental status - alert, oriented to person, place, and time, normal mood, behavior, speech, dress, motor activity, and thought processes, affect appropriate to mood  PELVIC External genitalia - Skin tag, non-bothersome Vagina - normal vaginal secretions, no pain vaginal side wall Cervix - IUD strings visualized, no pain when  Uterus - tenderness when touching ovary on right side.  Assessment & Plan:   A:  1. Chronic right abd pain 2. Abdominal  scar tissue, tenderness  3. Hirsutism under chin   P:  1. Rx Tramadol 2. Continue Ibuprofen 3. F/u if pain worsens with TV u/s 4. Check serum testosterone if normal will offer spironolactone    By signing my name below, I, Arnette Norris, attest that this documentation has been prepared under the direction and in the presence of [name], MD. Electronically Signed: Arnette Norris Medical Scribe. 12/22/18. 11:35 AM.  I personally performed the services described in this documentation, which was SCRIBED in my presence. The recorded information has been reviewed and considered accurate. It has been edited as necessary during review. [name], MD

## 2018-12-25 ENCOUNTER — Other Ambulatory Visit: Payer: 59

## 2018-12-26 ENCOUNTER — Telehealth: Payer: Self-pay | Admitting: Obstetrics and Gynecology

## 2018-12-26 ENCOUNTER — Other Ambulatory Visit: Payer: 59

## 2018-12-26 LAB — TESTOSTERONE: TESTOSTERONE: 10 ng/dL (ref 8–48)

## 2018-12-26 MED ORDER — SPIRONOLACTONE 25 MG PO TABS: 25.0000 mg | ORAL_TABLET | Freq: Every day | ORAL | 2 refills | Status: DC

## 2018-12-26 NOTE — Telephone Encounter (Signed)
Informed of normal serum testosterone level. Try 90 days of spironolactone to see if it reduces need for frequent shaving Patient to check on cosmetic laser therapy options and pricing

## 2019-01-29 ENCOUNTER — Ambulatory Visit (INDEPENDENT_AMBULATORY_CARE_PROVIDER_SITE_OTHER): Payer: 59 | Admitting: Adult Health

## 2019-01-29 ENCOUNTER — Other Ambulatory Visit (HOSPITAL_COMMUNITY)
Admission: RE | Admit: 2019-01-29 | Discharge: 2019-01-29 | Disposition: A | Discharge status: Home / Self Care | Payer: 59 | Source: Ambulatory Visit | Attending: Adult Health | Admitting: Adult Health

## 2019-01-29 ENCOUNTER — Encounter: Payer: Self-pay | Admitting: Adult Health

## 2019-01-29 VITALS — BP 123/82 | HR 73 | Ht 61.0 in | Wt 171.0 lb

## 2019-01-29 DIAGNOSIS — Z01419 Encounter for gynecological examination (general) (routine) without abnormal findings: Secondary | ICD-10-CM | POA: Diagnosis present

## 2019-01-29 DIAGNOSIS — Z975 Presence of (intrauterine) contraceptive device: Secondary | ICD-10-CM | POA: Diagnosis not present

## 2019-01-29 NOTE — Progress Notes (Signed)
Patient ID: Debbie [name], female   DOB: 01/21/1986, 33 y.o.   MRN: [id number] History of Present Illness:  Debbie [name] is a 33 year old white female, single, G2P1,in for well woman gyn exam and pap. She works for [employer]. She had biometrics done. PCP Dr Zachery Dauer.   Current Medications, Allergies, Past Medical History, Past Surgical History, Family History and Social History were reviewed in [employer] record.     Review of Systems: Patient denies any headaches, hearing loss, fatigue, blurred vision, shortness of breath, chest pain, abdominal pain, problems with bowel movements, urination, or intercourse(not active). No joint pain or mood swings. Spots some with IUD, can't feel strings.   Physical Exam:BP 123/82 (BP Location: Right Arm, Patient Position: Sitting, Cuff Size: Normal)   Pulse 73   Ht 5\' 1"  (1.549 m)   Wt 171 lb (77.6 kg)   BMI 32.31 kg/m  General:  Well developed, well nourished, no acute distress Skin:  Warm and dry Neck:  Midline trachea, normal thyroid, good ROM, no lymphadenopathy Lungs; Clear to auscultation bilaterally Breast:  No dominant palpable mass, retraction, or nipple discharge Cardiovascular: Regular rate and rhythm Abdomen:  Soft, non tender, no hepatosplenomegaly Pelvic:  External genitalia is normal in appearance, no lesions.  The vagina is normal in appearance. Urethra has no lesions or masses. The cervix is bulbous,+IUD strings at os, pap with HPV performed.  Uterus is felt to be normal size, shape, and contour.  No adnexal masses or tenderness noted.Bladder is non tender, no masses felt. Extremities/musculoskeletal:  No swelling or varicosities noted, no clubbing or cyanosis Psych:  No mood changes, alert and cooperative,seems happy Fall risk is low. PHQ 2 score 0. Examination chaperoned by Marylu Lund young LPN.   Impression: 1. Encounter for gynecological examination with Papanicolaou smear of cervix   2. IUD (intrauterine device) in  place       Plan: Physical in 1 year Pap in 3 if normal Mammogram at 41 Colonoscopy at 51

## 2019-02-01 LAB — CYTOLOGY - PAP
Chlamydia: NEGATIVE
Diagnosis: NEGATIVE
HPV (WINDOPATH): NOT DETECTED
NEISSERIA GONORRHEA: NEGATIVE

## 2019-04-19 ENCOUNTER — Telehealth: Payer: Self-pay | Admitting: Adult Health

## 2019-04-19 MED ORDER — VALACYCLOVIR HCL 1 G PO TABS: 1000.0000 mg | ORAL_TABLET | Freq: Two times a day (BID) | ORAL | 2 refills | Status: DC

## 2019-04-19 NOTE — Telephone Encounter (Signed)
Spoke with pt. Pt is having a herpes breakout. Started over a week ago. Can you order Valtrex? Thanks!! JSY

## 2019-04-19 NOTE — Telephone Encounter (Signed)
Refilled valtrex  

## 2019-04-19 NOTE — Telephone Encounter (Signed)
Patient called, requesting a refill on Valtrex.  [employer]  [phone]

## 2019-06-25 ENCOUNTER — Other Ambulatory Visit: Payer: Self-pay | Admitting: Adult Health

## 2020-03-25 ENCOUNTER — Other Ambulatory Visit: Payer: Self-pay

## 2020-03-25 ENCOUNTER — Encounter: Payer: Self-pay | Admitting: Adult Health

## 2020-03-25 ENCOUNTER — Ambulatory Visit (INDEPENDENT_AMBULATORY_CARE_PROVIDER_SITE_OTHER): Payer: 59 | Admitting: Adult Health

## 2020-03-25 VITALS — BP 139/83 | HR 84 | Ht 61.0 in | Wt 180.0 lb

## 2020-03-25 DIAGNOSIS — Z975 Presence of (intrauterine) contraceptive device: Secondary | ICD-10-CM | POA: Diagnosis not present

## 2020-03-25 DIAGNOSIS — Z01419 Encounter for gynecological examination (general) (routine) without abnormal findings: Secondary | ICD-10-CM | POA: Diagnosis not present

## 2020-03-25 NOTE — Progress Notes (Signed)
Patient ID: Debbie [name], female   DOB: 03-14-1986, 34 y.o.   MRN: [id number] History of Present Illness: Debbie [name] is a 34 year old white female, single, G2P1011, in for well woman gyn exam, she had normal pap with negative HPV,01/29/2019. She has mirena IUD, placed 08/11/17. PCP is Dr Sherwood Gambler.   Current Medications, Allergies, Past Medical History, Past Surgical History, Family History and Social History were reviewed in [employer] electronic medical record.     Review of Systems:  Patient denies any headaches, hearing loss, fatigue, blurred vision, shortness of breath, chest pain, abdominal pain, problems with bowel movements, urination, or intercourse. No joint pain or mood swings.   Physical Exam:BP 139/83 (BP Location: Right Arm, Patient Position: Sitting, Cuff Size: Normal)   Pulse 84   Ht 5\' 1"  (1.549 m)   Wt 180 lb (81.6 kg)   BMI 34.01 kg/m  General:  Well developed, well nourished, no acute distress Skin:  Warm and dry,tan Neck:  Midline trachea, normal thyroid, good ROM, no lymphadenopathy Lungs; Clear to auscultation bilaterally Breast:  No dominant palpable mass, retraction, or nipple discharge Cardiovascular: Regular rate and rhythm Abdomen:  Soft, non tender, no hepatosplenomegaly Pelvic:  External genitalia is normal in appearance, has skin tag base left labia.  The vagina is normal in appearance. Urethra has no lesions or masses. The cervix is bulbous. +IUD strings at os. Uterus is felt to be normal size, shape, and contour.  No adnexal masses or tenderness noted.Bladder is non tender, no masses felt. Extremities/musculoskeletal:  No swelling or varicosities noted, no clubbing or cyanosis Psych:  No mood changes, alert and cooperative,seems happy AA 2 Fall risk is low PHQ 9 score is 3. Examination chaperoned by LPN  Impression and Plan: 1. Encounter for well woman exam with routine gynecological exam Physical in 1 year Pap in 2023 Labs with  PCP, seeing him Thursday   2. IUD (intrauterine device) in place

## 2020-06-16 ENCOUNTER — Encounter: Payer: Self-pay | Admitting: Emergency Medicine

## 2020-06-16 ENCOUNTER — Other Ambulatory Visit: Payer: Self-pay

## 2020-06-16 ENCOUNTER — Ambulatory Visit
Admission: EM | Admit: 2020-06-16 | Discharge: 2020-06-16 | Disposition: A | Discharge status: Home / Self Care | Payer: 59 | Attending: Family Medicine | Admitting: Family Medicine

## 2020-06-16 ENCOUNTER — Ambulatory Visit (INDEPENDENT_AMBULATORY_CARE_PROVIDER_SITE_OTHER): Payer: 59

## 2020-06-16 DIAGNOSIS — R05 Cough: Secondary | ICD-10-CM | POA: Diagnosis not present

## 2020-06-16 DIAGNOSIS — Z1152 Encounter for screening for COVID-19: Secondary | ICD-10-CM

## 2020-06-16 DIAGNOSIS — R634 Abnormal weight loss: Secondary | ICD-10-CM | POA: Diagnosis not present

## 2020-06-16 DIAGNOSIS — R5383 Other fatigue: Secondary | ICD-10-CM | POA: Diagnosis not present

## 2020-06-16 DIAGNOSIS — M940 Chondrocostal junction syndrome [Tietze]: Secondary | ICD-10-CM

## 2020-06-16 DIAGNOSIS — R0781 Pleurodynia: Secondary | ICD-10-CM | POA: Diagnosis not present

## 2020-06-16 DIAGNOSIS — R059 Cough, unspecified: Secondary | ICD-10-CM

## 2020-06-16 MED ORDER — IBUPROFEN 600 MG PO TABS: 600.0000 mg | ORAL_TABLET | Freq: Four times a day (QID) | ORAL | 0 refills | Status: DC | PRN

## 2020-06-16 MED ORDER — CYCLOBENZAPRINE HCL 10 MG PO TABS: 10.0000 mg | ORAL_TABLET | Freq: Two times a day (BID) | ORAL | 0 refills | Status: DC | PRN

## 2020-06-16 MED ORDER — CETIRIZINE HCL 10 MG PO TABS: 10.0000 mg | ORAL_TABLET | Freq: Every day | ORAL | 0 refills | Status: DC

## 2020-06-16 MED ORDER — BENZONATATE 100 MG PO CAPS: 100.0000 mg | ORAL_CAPSULE | Freq: Three times a day (TID) | ORAL | 0 refills | Status: DC

## 2020-06-16 NOTE — Discharge Instructions (Addendum)
We are checking on some blood work. We will call you with any abnormal results and treat accordingly  I have sent in ibuprofen as well as flexeril for a muscle pain and spasms  I have sent in tessalon perles for your cough  I would also have you take zyrtec daily to help with the cough as well  Follow up with the ER for high fever, shortness of breath, other concerning symptoms  Your COVID test is pending.  You should self quarantine until the test result is back.    Take Tylenol as needed for fever or discomfort.  Rest and keep yourself hydrated.    Go to the emergency department if you develop acute worsening symptoms.

## 2020-06-16 NOTE — ED Triage Notes (Signed)
Pain to LT side of Rib area under breast that radiates to RT mid back since Wednesday.  Pain is worse with movement and coughing.  Pt reports she has had bad cough since the beginning of the month and has been tx for this by her pcp .

## 2020-06-17 LAB — CBC WITH DIFFERENTIAL/PLATELET
Basophils Absolute: 0 10*3/uL (ref 0.0–0.2)
Basos: 0 %
EOS (ABSOLUTE): 0 10*3/uL (ref 0.0–0.4)
Eos: 1 %
Hematocrit: 41.5 % (ref 34.0–46.6)
Hemoglobin: 14.3 g/dL (ref 11.1–15.9)
Immature Grans (Abs): 0 10*3/uL (ref 0.0–0.1)
Immature Granulocytes: 0 %
Lymphocytes Absolute: 1.6 10*3/uL (ref 0.7–3.1)
Lymphs: 30 %
MCH: 33.3 pg — ABNORMAL HIGH (ref 26.6–33.0)
MCHC: 34.5 g/dL (ref 31.5–35.7)
MCV: 97 fL (ref 79–97)
Monocytes Absolute: 0.4 10*3/uL (ref 0.1–0.9)
Monocytes: 8 %
Neutrophils Absolute: 3.3 10*3/uL (ref 1.4–7.0)
Neutrophils: 61 %
Platelets: 314 10*3/uL (ref 150–450)
RBC: 4.29 x10E6/uL (ref 3.77–5.28)
RDW: 11.5 % — ABNORMAL LOW (ref 11.7–15.4)
WBC: 5.4 10*3/uL (ref 3.4–10.8)

## 2020-06-17 LAB — BASIC METABOLIC PANEL
BUN/Creatinine Ratio: 14 (ref 9–23)
BUN: 12 mg/dL (ref 6–20)
CO2: 20 mmol/L (ref 20–29)
Calcium: 9.5 mg/dL (ref 8.7–10.2)
Chloride: 105 mmol/L (ref 96–106)
Creatinine, Ser: 0.84 mg/dL (ref 0.57–1.00)
GFR calc Af Amer: 106 mL/min/{1.73_m2} (ref >59)
GFR calc non Af Amer: 92 mL/min/{1.73_m2} (ref >59)
Glucose: 92 mg/dL (ref 65–99)
Potassium: 4 mmol/L (ref 3.5–5.2)
Sodium: 140 mmol/L (ref 134–144)

## 2020-06-17 LAB — NOVEL CORONAVIRUS, NAA: SARS-CoV-2, NAA: NOT DETECTED

## 2020-06-17 LAB — SARS-COV-2, NAA 2 DAY TAT

## 2020-06-20 NOTE — ED Provider Notes (Addendum)
[facility]   [id number] 06/16/20 Arrival Time: 1035   CC: COVID symptoms  SUBJECTIVE: History from: patient.  Debbie [name] is a 34 y.o. female who presents with nasal congestion, PND, and persistent dry cough for the last 3 weeks. Has been seen at her primary and has been through 2 rounds of antibiotics. Reports that she feels a little better, but cannot stop coughing. No hx Covid, has had Covid vaccines. Denies sick exposure to COVID, flu or strep. Denies recent travel. There are no aggravating or alleviating factors. Reporting fatigue and left rib pain as well. Reports that this is worse with coughing, laughing, movement. Has not had chest xray throughout her illness. Denies previous symptoms in the past. Denies fever, chills, sinus pain, rhinorrhea, sore throat, SOB, wheezing, chest pain, nausea, changes in bowel or bladder habits.    ROS: As per HPI.  All other pertinent ROS negative.     Past Medical History:  Diagnosis Date  . Anxiety   . Arthritis   . Back pain   . Breast mass, right 04/27/2016  . Breast tenderness 04/27/2016  . Chronic pelvic pain in female   . Fibromyalgia   . Folliculitis 01/09/2016  . Herpes simplex virus (HSV) infection    shingles  . Mental disorder   . Neuromuscular disorder (HCC)   . Shingles    Past Surgical History:  Procedure Laterality Date  . APPENDECTOMY    . CESAREAN SECTION  2012   Womens  . LAPAROSCOPIC APPENDECTOMY  02/04/2012   Procedure: APPENDECTOMY LAPAROSCOPIC;  Surgeon: [name], MD;  Location: [facility];  Service: Gynecology;  Laterality: N/A;  . LAPAROSCOPY  02/04/2012   Procedure: LAPAROSCOPY DIAGNOSTIC;  Surgeon: [name], MD;  Location: [facility];  Service: Gynecology;  Laterality: N/A;  diagnostic laparoscopy with incidental appendectomy  . WISDOM TOOTH EXTRACTION     Allergies  Allergen Reactions  . Amoxicillin     Childhood reaction   No current facility-administered medications on file prior to  encounter.   Current Outpatient Medications on File Prior to Encounter  Medication Sig Dispense Refill  . b complex vitamins capsule Take 1 capsule by mouth daily.     . Cholecalciferol (VITAMIN D-3 PO) Take by mouth daily.    Marland Kitchen levonorgestrel (MIRENA) 20 MCG/24HR IUD 1 each by Intrauterine route once.    Marland Kitchen MAGNESIUM PO Take by mouth daily.    . Omega-3 Fatty Acids (FISH OIL) 1000 MG CAPS Take by mouth daily.     . valACYclovir (VALTREX) 1000 MG tablet Take 1 tablet (1,000 mg total) by mouth 2 (two) times daily. 20 tablet 2  . [DISCONTINUED] medroxyPROGESTERone (DEPO-PROVERA) 150 MG/ML injection Inject 150 mg into the muscle every 3 (three) months.       Social History   Socioeconomic History  . Marital status: Single    Spouse name: Not on file  . Number of children: 1  . Years of education: Not on file  . Highest education level: Not on file  Occupational History    Employer: NOT EMPLOYED  Tobacco Use  . Smoking status: Former Smoker    Packs/day: 0.00    Years: 3.00    Pack years: 0.00    Types: Cigarettes    Quit date: 01/02/2011    Years since quitting: 9.4  . Smokeless tobacco: Never Used  Vaping Use  . Vaping Use: Never used  Substance and Sexual Activity  . Alcohol use: No  . Drug  use: No  . Sexual activity: Not Currently    Birth control/protection: I.U.D.  Other Topics Concern  . Not on file  Social History Narrative  . Not on file   Social Determinants of Health   Financial Resource Strain: Low Risk   . Difficulty of Paying Living Expenses: Not very hard  Food Insecurity: No Food Insecurity  . Worried About Programme researcher, broadcasting/film/video in the Last Year: Never true  . Ran Out of Food in the Last Year: Never true  Transportation Needs: No Transportation Needs  . Lack of Transportation (Medical): No  . Lack of Transportation (Non-Medical): No  Physical Activity: Sufficiently Active  . Days of Exercise per Week: 4 days  . Minutes of Exercise per Session: 60 min    Stress: No Stress Concern Present  . Feeling of Stress : Only a little  Social Connections: Moderately Isolated  . Frequency of Communication with Friends and Family: More than three times a week  . Frequency of Social Gatherings with Friends and Family: Twice a week  . Attends Religious Services: More than 4 times per year  . Active Member of Clubs or Organizations: No  . Attends Banker Meetings: Never  . Marital Status: Never married  Intimate Partner Violence: Not At Risk  . Fear of Current or Ex-Partner: No  . Emotionally Abused: No  . Physically Abused: No  . Sexually Abused: No   Family History  Problem Relation Age of Onset  . Diabetes Father   . Hypertension Father   . Kidney failure Father   . Liver disease Father   . Cirrhosis Father   . Stomach cancer Paternal Grandfather   . Cancer Paternal Grandfather        stomach cancer  . Endometriosis Mother   . Lupus Mother   . Cancer Maternal Grandmother        lung cancer  . Cancer Paternal Grandmother        lung cancer  . Eczema Son   . Hypertension Sister   . Lung cancer Other        both grandparents  . Colon cancer Neg Hx   . Anesthesia problems Neg Hx   . Hypotension Neg Hx   . Malignant hyperthermia Neg Hx   . Pseudochol deficiency Neg Hx     OBJECTIVE:  Vitals:   06/16/20 1043 06/16/20 1044  BP:  (!) 135/95  Pulse:  96  Resp:  17  Temp:  98.8 F (37.1 C)  TempSrc:  Oral  SpO2:  98%  Weight: 170 lb (77.1 kg)   Height: 5\' 1"  (1.549 m)      General appearance: alert; appears fatigued, but nontoxic; speaking in full sentences and tolerating own secretions HEENT: NCAT; Ears: EACs clear, TMs pearly gray; Eyes: PERRL.  EOM grossly intact. Sinuses: nontender; Nose: nares patent without rhinorrhea, Throat: oropharynx clear, tonsils non erythematous or enlarged, uvula midline  Neck: supple without LAD Musculoskeletal: tenderness o Lungs: unlabored respirations, symmetrical air entry;  cough: moderate; no respiratory distress; CTAB Heart: regular rate and rhythm.  Radial pulses 2+ symmetrical bilaterally Skin: warm and dry Psychological: alert and cooperative; normal mood and affect  LABS:  No results found for this or any previous visit (from the past 24 hour(s)).   ASSESSMENT & PLAN:  1. Pleuritic pain   2. Cough   3. Other fatigue   4. Encounter for screening for COVID-19   5. Loss of weight   6. Costochondritis  Meds ordered this encounter  Medications  . ibuprofen (ADVIL) 600 MG tablet    Sig: Take 1 tablet (600 mg total) by mouth every 6 (six) hours as needed.    Dispense:  30 tablet    Refill:  0    Order Specific Question:   Supervising Provider    Answer:   Merrilee Jansky [id number]  . cyclobenzaprine (FLEXERIL) 10 MG tablet    Sig: Take 1 tablet (10 mg total) by mouth 2 (two) times daily as needed for muscle spasms.    Dispense:  20 tablet    Refill:  0    Order Specific Question:   Supervising Provider    Answer:   Merrilee Jansky [id number]  . benzonatate (TESSALON) 100 MG capsule    Sig: Take 1 capsule (100 mg total) by mouth every 8 (eight) hours.    Dispense:  21 capsule    Refill:  0    Order Specific Question:   Supervising Provider    Answer:   Merrilee Jansky [id number]  . cetirizine (ZYRTEC ALLERGY) 10 MG tablet    Sig: Take 1 tablet (10 mg total) by mouth daily.    Dispense:  30 tablet    Refill:  0    Order Specific Question:   Supervising Provider    Answer:   Merrilee Jansky [id number]   Chest Xray negative Prescribed ibuprofen Prescribed cyclobenzaprine Prescribed tessalon perles Prescribed zyrtec CBC and CMP ordered Will follow up with positive results Follow up with primary care this week. Likely viral in etiology, unsure of the cause of continued cough COVID testing ordered.  It will take between 1-2 days for test results.  Someone will contact you regarding abnormal results.    Patient should remain in  quarantine until they have received Covid results.  If negative you may resume normal activities (go back to work/school) while practicing hand hygiene, social distance, and mask wearing.  If positive, patient should remain in quarantine for 10 days from symptom onset AND greater than 72 hours after symptoms resolution (absence of fever without the use of fever-reducing medication and improvement in respiratory symptoms), whichever is longer Get plenty of rest and push fluids Use OTC zyrtec for nasal congestion, runny nose, and/or sore throat Use OTC flonase for nasal congestion and runny nose Use medications daily for symptom relief Use OTC medications like ibuprofen or tylenol as needed fever or pain Call or go to the ED if you have any new or worsening symptoms such as fever, worsening cough, shortness of breath, chest tightness, chest pain, turning blue, changes in mental status.  Reviewed expectations re: course of current medical issues. Questions answered. Outlined signs and symptoms indicating need for more acute intervention. Patient verbalized understanding. After Visit Summary given.         Moshe Cipro, NP 06/20/20 4431    Moshe Cipro, NP 06/20/20 [phone]

## 2020-07-30 ENCOUNTER — Other Ambulatory Visit (HOSPITAL_COMMUNITY): Payer: Self-pay | Admitting: Family Medicine

## 2020-07-30 ENCOUNTER — Ambulatory Visit (HOSPITAL_COMMUNITY)
Admission: RE | Admit: 2020-07-30 | Discharge: 2020-07-30 | Disposition: A | Discharge status: Home / Self Care | Payer: 59 | Source: Ambulatory Visit | Attending: Family Medicine | Admitting: Family Medicine

## 2020-07-30 ENCOUNTER — Other Ambulatory Visit: Payer: Self-pay

## 2020-07-30 DIAGNOSIS — R0781 Pleurodynia: Secondary | ICD-10-CM | POA: Insufficient documentation

## 2020-08-06 ENCOUNTER — Emergency Department (HOSPITAL_COMMUNITY)
Admission: EM | Admit: 2020-08-06 | Discharge: 2020-08-07 | Disposition: A | Discharge status: Home / Self Care | Payer: 59 | Attending: Emergency Medicine | Admitting: Emergency Medicine

## 2020-08-06 ENCOUNTER — Emergency Department (HOSPITAL_COMMUNITY): Payer: 59

## 2020-08-06 ENCOUNTER — Encounter (HOSPITAL_COMMUNITY): Payer: Self-pay

## 2020-08-06 ENCOUNTER — Other Ambulatory Visit: Payer: Self-pay

## 2020-08-06 DIAGNOSIS — R0781 Pleurodynia: Secondary | ICD-10-CM

## 2020-08-06 DIAGNOSIS — X501XXA Overexertion from prolonged static or awkward postures, initial encounter: Secondary | ICD-10-CM | POA: Insufficient documentation

## 2020-08-06 DIAGNOSIS — Z87891 Personal history of nicotine dependence: Secondary | ICD-10-CM | POA: Insufficient documentation

## 2020-08-06 DIAGNOSIS — S299XXA Unspecified injury of thorax, initial encounter: Secondary | ICD-10-CM | POA: Diagnosis present

## 2020-08-06 DIAGNOSIS — S2232XA Fracture of one rib, left side, initial encounter for closed fracture: Secondary | ICD-10-CM | POA: Diagnosis not present

## 2020-08-06 DIAGNOSIS — R000 Tachycardia, unspecified: Secondary | ICD-10-CM | POA: Diagnosis not present

## 2020-08-06 DIAGNOSIS — R059 Cough, unspecified: Secondary | ICD-10-CM

## 2020-08-06 MED ORDER — ONDANSETRON 4 MG PO TBDP
4.0000 mg | ORAL_TABLET | Freq: Once | ORAL | Status: AC
  Administered 2020-08-06: 4 mg via ORAL
  Filled 2020-08-06: qty 1

## 2020-08-06 MED ORDER — HYDROMORPHONE HCL 1 MG/ML IJ SOLN
1.0000 mg | Freq: Once | INTRAMUSCULAR | Status: AC
  Administered 2020-08-06: 1 mg via INTRAMUSCULAR
  Filled 2020-08-06: qty 1

## 2020-08-06 NOTE — ED Provider Notes (Signed)
[facility] EMERGENCY DEPARTMENT Provider Note   CSN: [id number] Arrival date & time: 08/06/20  1357     History Chief Complaint  Patient presents with  . Back Pain  . Cough    Debbie [name] is a 34 y.o. female with medical history as outlined below, was diagnosed with a left seventh rib fracture mid July  which was suspected to be triggered from chronic dry cough which started after receiving her 2nd covid vaccine.   She was at work today when she twisted her torso and coughed at the same time and developed severe and sudden worse pain along her left chest wall beneath her breast which radiates around to her flank region.  Her pain is worse with movement and deep inspiration.  She denies nausea or vomiting, denies abdominal pain.  A coworker drove her here immediately after the event.  She has had no pain relievers prior to arrival.  She denies air hunger with breathing but reports severe pain with attempts at deep inspiration.  She denies lower extremity pain or swelling, denies fevers or chills, cough has been nonproductive. She has been taking ibuprofen 600 mg and celebrex without symptom relief. She had initially been prescribed flexeril which was not effective.   HPI     Past Medical History:  Diagnosis Date  . Anxiety   . Arthritis   . Back pain   . Breast mass, right 04/27/2016  . Breast tenderness 04/27/2016  . Chronic pelvic pain in female   . Folliculitis 01/09/2016  . Herpes simplex virus (HSV) infection    shingles  . Mental disorder   . Neuromuscular disorder (HCC)   . Shingles     Patient Active Problem List   Diagnosis Date Noted  . Encounter for well woman exam with routine gynecological exam 03/25/2020  . IUD (intrauterine device) in place 01/29/2019  . Encounter for gynecological examination with Papanicolaou smear of cervix 01/29/2019  . Breast mass, right 04/27/2016  . Breast tenderness 04/27/2016  . Folliculitis 01/09/2016  . Knee pain, bilateral  02/14/2012  . Chronic pelvic pain in female 02/04/2012  . RLQ abdominal pain 01/12/2012  . Lumbar radiculopathy 09/29/2011  . Low back pain 09/29/2011  . Chronic right SI joint pain 09/29/2011    Past Surgical History:  Procedure Laterality Date  . APPENDECTOMY    . CESAREAN SECTION  2012   Womens  . LAPAROSCOPIC APPENDECTOMY  02/04/2012   Procedure: APPENDECTOMY LAPAROSCOPIC;  Surgeon: [name], MD;  Location: [facility];  Service: Gynecology;  Laterality: N/A;  . LAPAROSCOPY  02/04/2012   Procedure: LAPAROSCOPY DIAGNOSTIC;  Surgeon: [name], MD;  Location: [facility];  Service: Gynecology;  Laterality: N/A;  diagnostic laparoscopy with incidental appendectomy  . WISDOM TOOTH EXTRACTION       OB History    Gravida  2   Para  1   Term  1   Preterm      AB  1   Living  1     SAB      TAB      Ectopic  1   Multiple      Live Births  1           Family History  Problem Relation Age of Onset  . Diabetes Father   . Hypertension Father   . Kidney failure Father   . Liver disease Father   . Cirrhosis Father   . Stomach cancer Paternal Grandfather   .  Cancer Paternal Grandfather        stomach cancer  . Endometriosis Mother   . Lupus Mother   . Cancer Maternal Grandmother        lung cancer  . Cancer Paternal Grandmother        lung cancer  . Eczema Son   . Hypertension Sister   . Lung cancer Other        both grandparents  . Colon cancer Neg Hx   . Anesthesia problems Neg Hx   . Hypotension Neg Hx   . Malignant hyperthermia Neg Hx   . Pseudochol deficiency Neg Hx     Social History   Tobacco Use  . Smoking status: Former Smoker    Packs/day: 0.00    Years: 3.00    Pack years: 0.00    Types: Cigarettes    Quit date: 01/02/2011    Years since quitting: 9.6  . Smokeless tobacco: Never Used  Vaping Use  . Vaping Use: Never used  Substance Use Topics  . Alcohol use: No  . Drug use: No    Home Medications Prior to Admission  medications   Medication Sig Start Date End Date Taking? Authorizing Provider  b complex vitamins capsule Take 1 capsule by mouth daily.     [provider]  benzonatate (TESSALON) 100 MG capsule Take 1 capsule (100 mg total) by mouth every 8 (eight) hours. 06/16/20   Moshe CiproMatthews, Stephanie, NP  cetirizine (ZYRTEC ALLERGY) 10 MG tablet Take 1 tablet (10 mg total) by mouth daily. 06/16/20   Moshe CiproMatthews, Stephanie, NP  Cholecalciferol (VITAMIN D-3 PO) Take by mouth daily.    [provider]  cyclobenzaprine (FLEXERIL) 10 MG tablet Take 1 tablet (10 mg total) by mouth 2 (two) times daily as needed for muscle spasms. 06/16/20   Moshe CiproMatthews, Stephanie, NP  ibuprofen (ADVIL) 600 MG tablet Take 1 tablet (600 mg total) by mouth every 6 (six) hours as needed. 06/16/20   Moshe CiproMatthews, Stephanie, NP  levonorgestrel (MIRENA) 20 MCG/24HR IUD 1 each by Intrauterine route once.    [provider]  MAGNESIUM PO Take by mouth daily.    [provider]  Omega-3 Fatty Acids (FISH OIL) 1000 MG CAPS Take by mouth daily.     [provider]  valACYclovir (VALTREX) 1000 MG tablet Take 1 tablet (1,000 mg total) by mouth 2 (two) times daily. 04/19/19   Adline PotterGriffin, Jennifer A, NP  medroxyPROGESTERone (DEPO-PROVERA) 150 MG/ML injection Inject 150 mg into the muscle every 3 (three) months.    01/28/12  [provider]    Allergies    Amoxicillin  Review of Systems   Review of Systems  Constitutional: Negative for chills and fever.  HENT: Negative for congestion and sore throat.   Eyes: Negative.   Respiratory: Positive for cough. Negative for chest tightness and shortness of breath.   Cardiovascular: Positive for chest pain.  Gastrointestinal: Negative for abdominal pain, nausea and vomiting.  Genitourinary: Negative.   Musculoskeletal: Negative for arthralgias, joint swelling and neck pain.  Skin: Negative.  Negative for rash and wound.  Neurological: Negative for dizziness,  weakness, light-headedness, numbness and headaches.  Psychiatric/Behavioral: Negative.   All other systems reviewed and are negative.   Physical Exam Updated Vital Signs BP (!) 140/99 (BP Location: Right Arm)   Pulse 95   Temp 98.7 F (37.1 C) (Oral)   Resp 17   Ht 5\' 1"  (1.549 m)   Wt 74.8 kg   SpO2 98%  BMI 31.18 kg/m   Physical Exam Vitals and nursing note reviewed.  Constitutional:      Appearance: She is well-developed.     Comments: tearful  HENT:     Head: Normocephalic and atraumatic.  Eyes:     Conjunctiva/sclera: Conjunctivae normal.  Cardiovascular:     Rate and Rhythm: Regular rhythm. Tachycardia present.     Heart sounds: Normal heart sounds. No murmur heard.   Pulmonary:     Breath sounds: No decreased air movement. Decreased breath sounds present. No wheezing.     Comments: Poor effort secondary to pain.  Pain with palpation left anterior chest wall beneath breast through the left flank.  No palpable deformity.   Chest:     Chest wall: Tenderness present.  Abdominal:     General: Bowel sounds are normal.     Palpations: Abdomen is soft.     Tenderness: There is no abdominal tenderness. There is left CVA tenderness.     Comments: No LUQ pain or guarding.  Musculoskeletal:        General: Normal range of motion.     Cervical back: Normal range of motion.  Skin:    General: Skin is warm and dry.  Neurological:     Mental Status: She is alert.     ED Results / Procedures / Treatments   Labs (all labs ordered are listed, but only abnormal results are displayed) Labs Reviewed  POC URINE PREG, ED    EKG None  Radiology DG Ribs Unilateral Left  Result Date: 08/06/2020 CLINICAL DATA:  Cough for months month EXAM: LEFT RIBS - 2 VIEW COMPARISON:  None. FINDINGS: There is a healing nondisplaced lateral seventh rib fracture. No other fracture is identified. The lungs are clear. Cardiomediastinal silhouette is unremarkable. IMPRESSION: Healing  nondisplaced lateral seventh rib fracture. Electronically Signed   By: Jonna Clark M.D.   On: 08/06/2020 23:08   DG Chest Port 1 View  Result Date: 08/06/2020 CLINICAL DATA:  Shortness of breath, bilateral rib pain, rib fracture in September EXAM: PORTABLE CHEST 1 VIEW COMPARISON:  Radiograph 07/30/2020 FINDINGS: Callus formation is seen at the site of a healing left seventh rib fracture. No additional displaced rib fractures are evident on this frontal only portable radiograph. No other significant osseous or soft tissue abnormality. No consolidation, features of edema, pneumothorax, or effusion. Pulmonary vascularity is normally distributed. The cardiomediastinal contours are unremarkable. IMPRESSION: 1. Healing left seventh rib fracture. No additional displaced rib fractures are evident. 2. No acute cardiopulmonary abnormality. Electronically Signed   By: Kreg Shropshire M.D.   On: 08/06/2020 15:31    Procedures Procedures (including critical care time)  Medications Ordered in ED Medications  HYDROmorphone (DILAUDID) injection 1 mg (1 mg Intramuscular Given 08/06/20 2219)  ondansetron (ZOFRAN-ODT) disintegrating tablet 4 mg (4 mg Oral Given 08/06/20 2219)    ED Course  I have reviewed the triage vital signs and the nursing notes.  Pertinent labs & imaging results that were available during my care of the patient were reviewed by me and considered in my medical decision making (see chart for details).    MDM Rules/Calculators/A&P                          Pt given IM dilaudid with transient improvement in pain.  Pt with a now 2 month history of chest wall pain with known rib fracture with persistent pain out of proportion to findings.  CT  scan ordered to assess for additional source of persistent pain.  Pt has been given incentive spirometer.   Dr. Clayborne Dana to dispo pt pending CT result.    Final Clinical Impression(s) / ED Diagnoses Final diagnoses:  Closed fracture of one rib of left  side, initial encounter    Rx / DC Orders ED Discharge Orders    None       Victoriano Lain 08/07/20 0128    Maia Plan, MD 08/11/20 [phone]

## 2020-08-06 NOTE — ED Triage Notes (Signed)
Pt to er, pt states that she has had a cough for a long time, states that in September she had a chest x ray and was dx with a fracture of her 7th rib.  Pt states that today she twisted and coughed and how is having some rib pain.  Pt talking in full sentences.  Pt reports increased pain with movement.

## 2020-08-07 ENCOUNTER — Emergency Department (HOSPITAL_COMMUNITY): Payer: 59

## 2020-08-07 MED ORDER — OXYCODONE-ACETAMINOPHEN 5-325 MG PO TABS: 2.0000 | ORAL_TABLET | ORAL | 0 refills | Status: DC | PRN

## 2020-08-07 NOTE — ED Notes (Signed)
Pt ambulatory to waiting room. Pt verbalized understanding of discharge instructions.   

## 2020-08-08 MED FILL — Oxycodone w/ Acetaminophen Tab 5-325 MG: ORAL | Qty: 6 | Status: AC

## 2021-02-04 ENCOUNTER — Encounter: Payer: Self-pay | Admitting: Adult Health

## 2021-02-04 ENCOUNTER — Other Ambulatory Visit: Payer: Self-pay

## 2021-02-04 ENCOUNTER — Ambulatory Visit (INDEPENDENT_AMBULATORY_CARE_PROVIDER_SITE_OTHER): Payer: 59 | Admitting: Adult Health

## 2021-02-04 VITALS — BP 131/90 | HR 90 | Ht 62.0 in | Wt 153.0 lb

## 2021-02-04 DIAGNOSIS — N809 Endometriosis, unspecified: Secondary | ICD-10-CM | POA: Insufficient documentation

## 2021-02-04 DIAGNOSIS — R1031 Right lower quadrant pain: Secondary | ICD-10-CM

## 2021-02-04 DIAGNOSIS — N8301 Follicular cyst of right ovary: Secondary | ICD-10-CM | POA: Insufficient documentation

## 2021-02-04 DIAGNOSIS — Z975 Presence of (intrauterine) contraceptive device: Secondary | ICD-10-CM

## 2021-02-04 DIAGNOSIS — F419 Anxiety disorder, unspecified: Secondary | ICD-10-CM | POA: Diagnosis not present

## 2021-02-04 DIAGNOSIS — F32A Depression, unspecified: Secondary | ICD-10-CM | POA: Insufficient documentation

## 2021-02-04 DIAGNOSIS — N8302 Follicular cyst of left ovary: Secondary | ICD-10-CM

## 2021-02-04 MED ORDER — SERTRALINE HCL 50 MG PO TABS: 50.0000 mg | ORAL_TABLET | Freq: Every day | ORAL | 3 refills | Status: DC

## 2021-02-04 MED ORDER — ORILISSA 150 MG PO TABS: ORAL_TABLET | ORAL | 0 refills | Status: DC

## 2021-02-04 NOTE — Progress Notes (Signed)
  Subjective:     Patient ID: Debbie [name], female   DOB: 02-07-1986, 35 y.o.   MRN: [id number]  HPI Debbie [name] is a 35 year old white female,single with SO, G2P1011, in today complaining of RLQ pain, is worse in last 6 months but has had pain for years. She is teary today. Last pap was normal with negative HPV 01/29/2019. She says she thinks has endometriosis and Dr Emelda Fear did laparotomy and removed appendix years ago. Has been taking advil and tylenol and using heating pad, feels worse toward right hip.  PCP is Dr Sherwood Gambler.   Review of Systems RLQ pain +pressure  Has not had sex lately, partner had cancer and radiation therapy +stressed, moved dad to [facility] Job is being affected Reviewed past medical,surgical, social and family history. Reviewed medications and allergies.     Objective:   Physical Exam BP 131/90 (BP Location: Left Arm, Patient Position: Sitting, Cuff Size: Normal)   Pulse 90   Ht 5\' 2"  (1.575 m)   Wt 153 lb (69.4 kg)   BMI 27.98 kg/m  Skin warm and dry.Pelvic: external genitalia is normal in appearance no lesions, vagina: scant discharge without odor,urethra has no lesions or masses noted, cervix:smooth and bulbous,+IUD strings at os, uterus: normal size, shape and contour, non tender, no masses felt, adnexa: no masses, RLQ tenderness noted. Bladder is non tender and no masses felt. POC shows IUD in place and has follicles on both ovaries.  Fall risk is low PHQ 9 score is 17, no SI or HI,   Upstream - 02/04/21 0910      Pregnancy Intention Screening   Does the patient want to become pregnant in the next year? No    Does the patient's partner want to become pregnant in the next year? No    Would the patient like to discuss contraceptive options today? No      Contraception Wrap Up   Current Method IUD or IUS    End Method IUD or IUS    Contraception Counseling Provided No         Examination chaperoned by 02/06/21 LPN    Assessment:       1. RLQ  abdominal pain  2. Endometriosis Will try orilissa to see if helps with pain  Meds ordered this encounter  Medications  . Elagolix Sodium (ORILISSA) 150 MG TABS    Sig: Take 1 daily    Dispense:  42 tablet    Refill:  0    Order Specific Question:   Supervising Provider    Answer:   EURE, LUTHER H [[id number]]  . sertraline (ZOLOFT) 50 MG tablet    Sig: Take 1 tablet (50 mg total) by mouth daily.    Dispense:  30 tablet    Refill:  3    Order Specific Question:   Supervising Provider    Answer:   Malachy Mood, LUTHER H [[id number]]    3. Follicular cyst of both ovaries   4. Anxiety and depression Will start Zoloft   5. IUD (intrauterine device) in place     Plan:     Follow up with me in 5 weeks

## 2021-03-11 ENCOUNTER — Ambulatory Visit: Payer: 59 | Admitting: Adult Health

## 2021-09-01 ENCOUNTER — Other Ambulatory Visit: Payer: Self-pay

## 2021-09-01 ENCOUNTER — Encounter (HOSPITAL_COMMUNITY): Payer: Self-pay | Admitting: *Deleted

## 2021-09-01 ENCOUNTER — Emergency Department (HOSPITAL_COMMUNITY): Payer: 59

## 2021-09-01 ENCOUNTER — Emergency Department (HOSPITAL_COMMUNITY)
Admission: EM | Admit: 2021-09-01 | Discharge: 2021-09-01 | Disposition: A | Discharge status: Home / Self Care | Payer: 59 | Attending: Emergency Medicine | Admitting: Emergency Medicine

## 2021-09-01 DIAGNOSIS — R0789 Other chest pain: Secondary | ICD-10-CM | POA: Insufficient documentation

## 2021-09-01 DIAGNOSIS — Z87891 Personal history of nicotine dependence: Secondary | ICD-10-CM | POA: Diagnosis not present

## 2021-09-01 LAB — BASIC METABOLIC PANEL
Anion gap: 9 (ref 5–15)
BUN: 9 mg/dL (ref 6–20)
CO2: 25 mmol/L (ref 22–32)
Calcium: 9.2 mg/dL (ref 8.9–10.3)
Chloride: 105 mmol/L (ref 98–111)
Creatinine, Ser: 0.61 mg/dL (ref 0.44–1.00)
GFR, Estimated: >60 mL/min (ref >60)
Glucose, Bld: 89 mg/dL (ref 70–99)
Potassium: 3.5 mmol/L (ref 3.5–5.1)
Sodium: 139 mmol/L (ref 135–145)

## 2021-09-01 LAB — CBC
HCT: 42.4 % (ref 36.0–46.0)
Hemoglobin: 14.9 g/dL (ref 12.0–15.0)
MCH: 34.1 pg — ABNORMAL HIGH (ref 26.0–34.0)
MCHC: 35.1 g/dL (ref 30.0–36.0)
MCV: 97 fL (ref 80.0–100.0)
Platelets: 332 10*3/uL (ref 150–400)
RBC: 4.37 MIL/uL (ref 3.87–5.11)
RDW: 11.8 % (ref 11.5–15.5)
WBC: 7.5 10*3/uL (ref 4.0–10.5)
nRBC: 0 % (ref 0.0–0.2)

## 2021-09-01 LAB — TROPONIN I (HIGH SENSITIVITY)
Troponin I (High Sensitivity): <2 ng/L (ref <18)
Troponin I (High Sensitivity): <2 ng/L (ref <18)

## 2021-09-01 LAB — D-DIMER, QUANTITATIVE: D-Dimer, Quant: 0.3 ug/mL-FEU (ref 0.00–0.50)

## 2021-09-01 NOTE — Discharge Instructions (Addendum)
Follow-up with your doctor tomorrow as planned 

## 2021-09-01 NOTE — ED Triage Notes (Signed)
Patient has multiple complaints for 1 week, c/o chest pain

## 2021-09-01 NOTE — ED Provider Notes (Signed)
[facility] EMERGENCY DEPARTMENT Provider Note   CSN: [id number] Arrival date & time: 09/01/21  9449     History No chief complaint on file.   Debbie [name] is a 35 y.o. female.  Patient complains of chest discomfort off and on  The history is provided by the patient and medical records. No language interpreter was used.  Chest Pain Pain location:  L chest Pain quality: aching   Pain radiates to:  Does not radiate Pain severity:  Mild Onset quality:  Sudden Timing:  Intermittent Progression:  Waxing and waning Chronicity:  New Context: not breathing   Relieved by:  Nothing Worsened by:  Nothing Ineffective treatments:  None tried Associated symptoms: no abdominal pain, no back pain, no cough, no fatigue and no headache       Past Medical History:  Diagnosis Date   Anxiety    Arthritis    Back pain    Breast mass, right 04/27/2016   Breast tenderness 04/27/2016   Chronic pelvic pain in female    Folliculitis 01/09/2016   Herpes simplex virus (HSV) infection    shingles   Mental disorder    Neuromuscular disorder (HCC)    Shingles     Patient Active Problem List   Diagnosis Date Noted   Follicular cyst of both ovaries 02/04/2021   Endometriosis 02/04/2021   Anxiety and depression 02/04/2021   Encounter for well woman exam with routine gynecological exam 03/25/2020   IUD (intrauterine device) in place 01/29/2019   Encounter for gynecological examination with Papanicolaou smear of cervix 01/29/2019   Breast mass, right 04/27/2016   Breast tenderness 04/27/2016   Folliculitis 01/09/2016   Knee pain, bilateral 02/14/2012   Chronic pelvic pain in female 02/04/2012   RLQ abdominal pain 01/12/2012   Lumbar radiculopathy 09/29/2011   Low back pain 09/29/2011   Chronic right SI joint pain 09/29/2011    Past Surgical History:  Procedure Laterality Date   APPENDECTOMY     CESAREAN SECTION  2012   Womens   LAPAROSCOPIC APPENDECTOMY  02/04/2012   Procedure:  APPENDECTOMY LAPAROSCOPIC;  Surgeon: [name], MD;  Location: [facility];  Service: Gynecology;  Laterality: N/A;   LAPAROSCOPY  02/04/2012   Procedure: LAPAROSCOPY DIAGNOSTIC;  Surgeon: [name], MD;  Location: [facility];  Service: Gynecology;  Laterality: N/A;  diagnostic laparoscopy with incidental appendectomy   WISDOM TOOTH EXTRACTION       OB History     Gravida  2   Para  1   Term  1   Preterm      AB  1   Living  1      SAB      IAB      Ectopic  1   Multiple      Live Births  1           Family History  Problem Relation Age of Onset   Diabetes Father    Hypertension Father    Kidney failure Father    Liver disease Father    Cirrhosis Father    Stomach cancer Paternal Grandfather    Cancer Paternal Grandfather        stomach cancer   Endometriosis Mother    Lupus Mother    Cancer Maternal Grandmother        lung cancer   Cancer Paternal Grandmother        lung cancer   Eczema Son    Hypertension Sister  Lung cancer Other        both grandparents   Colon cancer Neg Hx    Anesthesia problems Neg Hx    Hypotension Neg Hx    Malignant hyperthermia Neg Hx    Pseudochol deficiency Neg Hx     Social History   Tobacco Use   Smoking status: Former    Packs/day: 0.00    Years: 3.00    Pack years: 0.00    Types: Cigarettes    Quit date: 01/02/2011    Years since quitting: 10.6   Smokeless tobacco: Never  Vaping Use   Vaping Use: Never used  Substance Use Topics   Alcohol use: No   Drug use: No    Home Medications Prior to Admission medications   Medication Sig Start Date End Date Taking? Authorizing Provider  Acetaminophen (TYLENOL PO) Take by mouth as needed.   Yes [provider]  BIOTIN PO Take 1 tablet by mouth daily.   Yes [provider]  ibuprofen (ADVIL) 200 MG tablet Take 400 mg by mouth as needed.   Yes [provider]  levonorgestrel (MIRENA) 20 MCG/24HR IUD 1 each by Intrauterine route  once.   Yes [provider]  b complex vitamins capsule Take 1 capsule by mouth daily.  Patient not taking: Reported on 09/01/2021    [provider]  Cholecalciferol (VITAMIN D-3 PO) Take by mouth daily. Patient not taking: Reported on 09/01/2021    [provider]  Elagolix Sodium (ORILISSA) 150 MG TABS Take 1 daily Patient not taking: Reported on 09/01/2021 02/04/21   Adline Potter, NP  Omega-3 Fatty Acids (FISH OIL) 1000 MG CAPS Take by mouth daily.  Patient not taking: Reported on 09/01/2021    [provider]  sertraline (ZOLOFT) 50 MG tablet Take 1 tablet (50 mg total) by mouth daily. Patient not taking: Reported on 09/01/2021 02/04/21   Adline Potter, NP  medroxyPROGESTERone (DEPO-PROVERA) 150 MG/ML injection Inject 150 mg into the muscle every 3 (three) months.    01/28/12  [provider]    Allergies    Amoxicillin  Review of Systems   Review of Systems  Constitutional:  Negative for appetite change and fatigue.  HENT:  Negative for congestion, ear discharge and sinus pressure.   Eyes:  Negative for discharge.  Respiratory:  Negative for cough.   Cardiovascular:  Positive for chest pain.  Gastrointestinal:  Negative for abdominal pain and diarrhea.  Genitourinary:  Negative for frequency and hematuria.  Musculoskeletal:  Negative for back pain.  Skin:  Negative for rash.  Neurological:  Negative for seizures and headaches.  Psychiatric/Behavioral:  Negative for hallucinations.    Physical Exam Updated Vital Signs BP 99/63   Pulse 80   Temp 98.4 F (36.9 C) (Oral)   Resp 19   Ht 5\' 1"  (1.549 m)   Wt 70.3 kg   SpO2 100%   BMI 29.29 kg/m   Physical Exam Vitals reviewed.  Constitutional:      Appearance: She is well-developed.  HENT:     Head: Normocephalic.     Nose: Nose normal.  Eyes:     General: No scleral icterus.    Conjunctiva/sclera: Conjunctivae normal.  Neck:     Thyroid: No thyromegaly.   Cardiovascular:     Rate and Rhythm: Normal rate and regular rhythm.     Heart sounds: No murmur heard.   No friction rub. No gallop.  Pulmonary:     Breath  sounds: No stridor. No wheezing or rales.  Chest:     Chest wall: No tenderness.  Abdominal:     General: There is no distension.     Tenderness: There is no abdominal tenderness. There is no rebound.  Musculoskeletal:        General: Normal range of motion.     Cervical back: Neck supple.  Lymphadenopathy:     Cervical: No cervical adenopathy.  Skin:    Findings: No erythema or rash.  Neurological:     Mental Status: She is oriented to person, place, and time.     Motor: No abnormal muscle tone.     Coordination: Coordination normal.  Psychiatric:        Behavior: Behavior normal.    ED Results / Procedures / Treatments   Labs (all labs ordered are listed, but only abnormal results are displayed) Labs Reviewed  CBC - Abnormal; Notable for the following components:      Result Value   MCH 34.1 (*)    All other components within normal limits  BASIC METABOLIC PANEL  D-DIMER, QUANTITATIVE  POC URINE PREG, ED  TROPONIN I (HIGH SENSITIVITY)  TROPONIN I (HIGH SENSITIVITY)    EKG EKG Interpretation  Date/Time:  Tuesday September 01 2021 16:28:44 EDT Ventricular Rate:  95 PR Interval:  148 QRS Duration: 84 QT Interval:  360 QTC Calculation: 452 R Axis:   65 Text Interpretation: Normal sinus rhythm Normal ECG Confirmed by Bethann Berkshire [id number]) on 09/01/2021 6:26:43 PM  Radiology DG Chest 2 View  Result Date: 09/01/2021 CLINICAL DATA:  Chest pain. EXAM: CHEST - 2 VIEW COMPARISON:  08/06/2020. FINDINGS: The heart size and mediastinal contours are within normal limits. Both lungs are clear. No visible pleural effusions or pneumothorax. No acute osseous abnormality. IMPRESSION: No evidence of acute cardiopulmonary disease. Electronically Signed   By: Feliberto Harts M.D.   On: 09/01/2021 17:17     Procedures Procedures   Medications Ordered in ED Medications - No data to display  ED Course  I have reviewed the triage vital signs and the nursing notes.  Pertinent labs & imaging results that were available during my care of the patient were reviewed by me and considered in my medical decision making (see chart for details). Labs EKG chest x-ray all unremarkable.  Patient will be discharged home doubt coronary artery disease   MDM Rules/Calculators/A&P                           Atypical chest pain possibly related to stress. Final Clinical Impression(s) / ED Diagnoses Final diagnoses:  Atypical chest pain    Rx / DC Orders ED Discharge Orders     None        Bethann Berkshire, MD 09/08/21 1729

## 2022-09-30 ENCOUNTER — Telehealth: Payer: Self-pay

## 2022-09-30 NOTE — Telephone Encounter (Signed)
Patient called and stated that she would like to speak with a nurse about some concerns she having with her Mirena.

## 2022-09-30 NOTE — Telephone Encounter (Signed)
Spoke with patient. She had questions about having a period with her mirena. Advised that she could still have a period but a lot of women do not. Advised to monitor symptoms and if bleeding doesn't improve to call us back or schedule appt.

## 2022-10-08 ENCOUNTER — Ambulatory Visit (INDEPENDENT_AMBULATORY_CARE_PROVIDER_SITE_OTHER): Payer: 59 | Admitting: Obstetrics & Gynecology

## 2022-10-08 ENCOUNTER — Encounter: Payer: Self-pay | Admitting: Obstetrics & Gynecology

## 2022-10-08 VITALS — BP 144/94 | HR 83 | Wt 172.4 lb

## 2022-10-08 DIAGNOSIS — Z975 Presence of (intrauterine) contraceptive device: Secondary | ICD-10-CM | POA: Diagnosis not present

## 2022-10-08 DIAGNOSIS — Z3490 Encounter for supervision of normal pregnancy, unspecified, unspecified trimester: Secondary | ICD-10-CM

## 2022-10-08 DIAGNOSIS — Z3201 Encounter for pregnancy test, result positive: Secondary | ICD-10-CM

## 2022-10-08 DIAGNOSIS — R102 Pelvic and perineal pain: Secondary | ICD-10-CM | POA: Diagnosis not present

## 2022-10-08 LAB — POCT URINE PREGNANCY: Preg Test, Ur: POSITIVE — AB

## 2022-10-08 NOTE — Progress Notes (Signed)
   GYN VISIT Patient name: Debbie [name] MRN [id number]  Date of birth: [birth year] Chief Complaint:   right lower quad pain (Started bleeding last Thursday-took pregnancy test which showed positive)  History of Present Illness:   Debbie [name] is a 36 y.o. G58P1011 female being seen today for the following concerns:  Positive pregnancy test with IUD.  Mirena placed in 2018 and typically does not have a period with this form of contraception.  However, menses started last Thursday typically using about 2 pads per day.  Last for about 5 days and has started to slow.  She also notes RLQ pain.  Denies nausea/vomiting.  No other acute complaints  Notes h/o ectopic prior to the birth of her son- treated with MTX     02/04/2021    9:46 AM 03/25/2020    3:31 PM 01/29/2019    3:09 PM 01/13/2017    2:06 PM  Depression screen PHQ 2/9  Decreased Interest 3 1 0 0  Down, Depressed, Hopeless 3 1 0 0  PHQ - 2 Score 6 2 0 0  Altered sleeping 2 0    Tired, decreased energy 2 1    Change in appetite 3 0    Feeling bad or failure about yourself  1 0    Trouble concentrating 3 0    Moving slowly or fidgety/restless 0 0    Suicidal thoughts 0 0    PHQ-9 Score 17 3       Review of Systems:   Pertinent items are noted in HPI Denies fever/chills, dizziness, headaches, visual disturbances, fatigue, shortness of breath, chest pain. Pertinent History Reviewed:  Reviewed past medical,surgical, social, obstetrical and family history.  Reviewed problem list, medications and allergies. Physical Assessment:   Vitals:   10/08/22 1139  BP: (!) 144/94  Pulse: 83  Weight: 172 lb 6.4 oz (78.2 kg)  Body mass index is 32.57 kg/m.       Physical Examination:   General appearance: alert, well appearing, and in no distress  Psych: mood appropriate, normal affect  Skin: warm & dry   Cardiovascular: normal heart rate noted  Respiratory: normal respiratory effort, no distress  Abdomen: soft, non-tender,  no rebound, no guarding  Pelvic: VULVA: normal appearing vulva with no masses, tenderness or lesions, VAGINA: normal appearing vagina with normal color and discharge, no lesions- no blood noted CERVIX: normal appearing cervix without discharge or lesions, strings visualized- though appear long UTERUS: uterus is normal size, shape, consistency and nontender, ADNEXA: normal adnexa in size, nontender and no masses  Extremities: no edema   Chaperone:  pt declined     Assessment & Plan:  1) Early pregnancy with pelvic pain -plan for HCG today -schedule for pelvic US at next available -discussed potential diagnosis including normal early IUP, miscarriage or ectopic pregnancy -pending results likely need serial hCG -reviewed ectopic precautions and advised to go to [facility] with any acute concerns -questions/concerns were addressed  2) IUD in place -will leave in place for now   Orders Placed This Encounter  Procedures   US PELVIC COMPLETE WITH TRANSVAGINAL   Beta hCG quant (ref lab)   POCT urine pregnancy    Return for pt to lab AND please schedule pelvic US ASAP.   Myna Hidalgo, DO Attending Obstetrician & Gynecologist, [facility] for [employer], [facility]

## 2022-10-09 LAB — BETA HCG QUANT (REF LAB): hCG Quant: 307 m[IU]/mL

## 2022-10-11 ENCOUNTER — Other Ambulatory Visit: Payer: Self-pay | Admitting: Obstetrics & Gynecology

## 2022-10-11 ENCOUNTER — Inpatient Hospital Stay (HOSPITAL_BASED_OUTPATIENT_CLINIC_OR_DEPARTMENT_OTHER)
Admission: RE | Admit: 2022-10-11 | Discharge: 2022-10-11 | Disposition: A | Discharge status: Home / Self Care | Payer: 59 | Source: Ambulatory Visit | Attending: Obstetrics & Gynecology | Admitting: Obstetrics & Gynecology

## 2022-10-11 ENCOUNTER — Encounter (HOSPITAL_COMMUNITY): Payer: Self-pay | Admitting: Obstetrics and Gynecology

## 2022-10-11 ENCOUNTER — Other Ambulatory Visit: Payer: Self-pay

## 2022-10-11 ENCOUNTER — Inpatient Hospital Stay (HOSPITAL_COMMUNITY)
Admission: AD | Admit: 2022-10-11 | Discharge: 2022-10-11 | Disposition: A | Discharge status: Home / Self Care | Payer: 59 | Attending: Obstetrics and Gynecology | Admitting: Obstetrics and Gynecology

## 2022-10-11 DIAGNOSIS — O26899 Other specified pregnancy related conditions, unspecified trimester: Secondary | ICD-10-CM | POA: Insufficient documentation

## 2022-10-11 DIAGNOSIS — Z3490 Encounter for supervision of normal pregnancy, unspecified, unspecified trimester: Secondary | ICD-10-CM

## 2022-10-11 DIAGNOSIS — Z3A00 Weeks of gestation of pregnancy not specified: Secondary | ICD-10-CM | POA: Insufficient documentation

## 2022-10-11 DIAGNOSIS — O00101 Right tubal pregnancy without intrauterine pregnancy: Secondary | ICD-10-CM | POA: Diagnosis not present

## 2022-10-11 DIAGNOSIS — Z975 Presence of (intrauterine) contraceptive device: Secondary | ICD-10-CM

## 2022-10-11 DIAGNOSIS — R102 Pelvic and perineal pain: Secondary | ICD-10-CM

## 2022-10-11 DIAGNOSIS — Z3201 Encounter for pregnancy test, result positive: Secondary | ICD-10-CM

## 2022-10-11 LAB — COMPREHENSIVE METABOLIC PANEL
ALT: 19 U/L (ref 0–44)
AST: 18 U/L (ref 15–41)
Albumin: 4.3 g/dL (ref 3.5–5.0)
Alkaline Phosphatase: 55 U/L (ref 38–126)
Anion gap: 12 (ref 5–15)
BUN: 11 mg/dL (ref 6–20)
CO2: 25 mmol/L (ref 22–32)
Calcium: 9.3 mg/dL (ref 8.9–10.3)
Chloride: 104 mmol/L (ref 98–111)
Creatinine, Ser: 0.76 mg/dL (ref 0.44–1.00)
GFR, Estimated: >60 mL/min (ref >60)
Glucose, Bld: 101 mg/dL — ABNORMAL HIGH (ref 70–99)
Potassium: 3.5 mmol/L (ref 3.5–5.1)
Sodium: 141 mmol/L (ref 135–145)
Total Bilirubin: 0.4 mg/dL (ref 0.3–1.2)
Total Protein: 7.2 g/dL (ref 6.5–8.1)

## 2022-10-11 LAB — CBC WITH DIFFERENTIAL/PLATELET
Abs Immature Granulocytes: 0.01 10*3/uL (ref 0.00–0.07)
Basophils Absolute: 0 10*3/uL (ref 0.0–0.1)
Basophils Relative: 0 %
Eosinophils Absolute: 0 10*3/uL (ref 0.0–0.5)
Eosinophils Relative: 1 %
HCT: 36.5 % (ref 36.0–46.0)
Hemoglobin: 13.2 g/dL (ref 12.0–15.0)
Immature Granulocytes: 0 %
Lymphocytes Relative: 27 %
Lymphs Abs: 1.7 10*3/uL (ref 0.7–4.0)
MCH: 34.5 pg — ABNORMAL HIGH (ref 26.0–34.0)
MCHC: 36.2 g/dL — ABNORMAL HIGH (ref 30.0–36.0)
MCV: 95.3 fL (ref 80.0–100.0)
Monocytes Absolute: 0.4 10*3/uL (ref 0.1–1.0)
Monocytes Relative: 6 %
Neutro Abs: 4.2 10*3/uL (ref 1.7–7.7)
Neutrophils Relative %: 66 %
Platelets: 298 10*3/uL (ref 150–400)
RBC: 3.83 MIL/uL — ABNORMAL LOW (ref 3.87–5.11)
RDW: 11.6 % (ref 11.5–15.5)
WBC: 6.4 10*3/uL (ref 4.0–10.5)
nRBC: 0 % (ref 0.0–0.2)

## 2022-10-11 LAB — HCG, QUANTITATIVE, PREGNANCY: hCG, Beta Chain, Quant, S: 534 m[IU]/mL — ABNORMAL HIGH (ref <5)

## 2022-10-11 MED ORDER — METHOTREXATE FOR ECTOPIC PREGNANCY
50.0000 mg/m2 | Freq: Once | INTRAMUSCULAR | Status: AC
  Administered 2022-10-11: 92.5 mg via INTRAMUSCULAR
  Filled 2022-10-11: qty 10

## 2022-10-11 MED ORDER — RHO D IMMUNE GLOBULIN 1500 UNIT/2ML IJ SOSY
300.0000 ug | PREFILLED_SYRINGE | Freq: Once | INTRAMUSCULAR | Status: AC
  Administered 2022-10-11: 300 ug via INTRAMUSCULAR
  Filled 2022-10-11: qty 2

## 2022-10-11 MED ORDER — ACETAMINOPHEN 500 MG PO TABS
1000.0000 mg | ORAL_TABLET | Freq: Once | ORAL | Status: AC
  Administered 2022-10-11: 1000 mg via ORAL
  Filled 2022-10-11: qty 2

## 2022-10-11 NOTE — MAU Provider Note (Signed)
MAU Provider Note  History  [id number]  Arrival date and time: 10/11/22 1448   Chief Complaint  Patient presents with   Abdominal Pain   Vaginal Bleeding     HPI Debbie [name] is a 36 y.o. G3P101 with PMHx notable for IUD in place, who presents for ectopic pregnancy confirmed today via ultrasound.  Patient has abdominal cramping and vaginal bleed.  Patient is very emotional about having an ectopic pregnancy, and would like her IUD removed, however she is willing to wait until she is completed treatment.  Declines fever, chills, shortness of breath, worsening of her vaginal bleeding.   Vaginal bleeding: Yes  --/--/A NEG (11/20 1536)  OB History     Gravida  3   Para  1   Term  1   Preterm      AB  1   Living  1      SAB      IAB      Ectopic  1   Multiple      Live Births  1           Past Medical History:  Diagnosis Date   Anxiety    Arthritis    Back pain    Breast mass, right 04/27/2016   Breast tenderness 04/27/2016   Chronic pelvic pain in female    Folliculitis 01/09/2016   Herpes simplex virus (HSV) infection    shingles   Mental disorder    Neuromuscular disorder (HCC)    Shingles     Past Surgical History:  Procedure Laterality Date   APPENDECTOMY     CESAREAN SECTION  2012   [facility]   LAPAROSCOPIC APPENDECTOMY  02/04/2012   Procedure: APPENDECTOMY LAPAROSCOPIC;  Surgeon: [name], MD;  Location: [facility];  Service: Gynecology;  Laterality: N/A;   LAPAROSCOPY  02/04/2012   Procedure: LAPAROSCOPY DIAGNOSTIC;  Surgeon: [name], MD;  Location: [facility];  Service: Gynecology;  Laterality: N/A;  diagnostic laparoscopy with incidental appendectomy   WISDOM TOOTH EXTRACTION      Family History  Problem Relation Age of Onset   Diabetes Father    Hypertension Father    Kidney failure Father    Liver disease Father    Cirrhosis Father    Stomach cancer Paternal Grandfather    Cancer Paternal Grandfather        stomach  cancer   Endometriosis Mother    Lupus Mother    Cancer Maternal Grandmother        lung cancer   Cancer Paternal Grandmother        lung cancer   Eczema Son    Hypertension Sister    Lung cancer Other        both grandparents   Colon cancer Neg Hx    Anesthesia problems Neg Hx    Hypotension Neg Hx    Malignant hyperthermia Neg Hx    Pseudochol deficiency Neg Hx     Social History   Socioeconomic History   Marital status: Single    Spouse name: Not on file   Number of children: 1   Years of education: Not on file   Highest education level: Not on file  Occupational History    Employer: NOT EMPLOYED  Tobacco Use   Smoking status: Former    Packs/day: 0.00    Years: 3.00    Total pack years: 0.00    Types: Cigarettes    Quit date: 01/02/2011  Years since quitting: 11.7   Smokeless tobacco: Never  Vaping Use   Vaping Use: Never used  Substance and Sexual Activity   Alcohol use: No   Drug use: No   Sexual activity: Yes    Birth control/protection: I.U.D.  Other Topics Concern   Not on file  Social History Narrative   Not on file   Social Determinants of Health   Financial Resource Strain: Low Risk  (03/25/2020)   Overall Financial Resource Strain (CARDIA)    Difficulty of Paying Living Expenses: Not very hard  Food Insecurity: No Food Insecurity (03/25/2020)   Hunger Vital Sign    Worried About Running Out of Food in the Last Year: Never true    Ran Out of Food in the Last Year: Never true  Transportation Needs: No Transportation Needs (03/25/2020)   PRAPARE - Administrator, Civil Service (Medical): No    Lack of Transportation (Non-Medical): No  Physical Activity: Sufficiently Active (03/25/2020)   Exercise Vital Sign    Days of Exercise per Week: 4 days    Minutes of Exercise per Session: 60 min  Stress: No Stress Concern Present (03/25/2020)   [employer] of Occupational Health - Occupational Stress Questionnaire    Feeling of Stress : Only  a little  Social Connections: Moderately Isolated (03/25/2020)   Social Connection and Isolation Panel [NHANES]    Frequency of Communication with Friends and Family: More than three times a week    Frequency of Social Gatherings with Friends and Family: Twice a week    Attends Religious Services: More than 4 times per year    Active Member of [employer] or Organizations: No    Attends Banker Meetings: Never    Marital Status: Never married  Intimate Partner Violence: Not At Risk (03/25/2020)   Humiliation, Afraid, Rape, and Kick questionnaire    Fear of Current or Ex-Partner: No    Emotionally Abused: No    Physically Abused: No    Sexually Abused: No    Allergies  Allergen Reactions   Amoxicillin     Childhood reaction    No current facility-administered medications on file prior to encounter.   Current Outpatient Medications on File Prior to Encounter  Medication Sig Dispense Refill   Acetaminophen (TYLENOL PO) Take by mouth as needed. (Patient not taking: Reported on 10/08/2022)     ALPRAZolam (XANAX) 0.25 MG tablet Take 0.25 mg by mouth 3 (three) times daily.     b complex vitamins capsule Take 1 capsule by mouth daily.     BIOTIN PO Take 1 tablet by mouth daily.     Cholecalciferol (VITAMIN D-3 PO) Take by mouth daily.     Elagolix Sodium (ORILISSA) 150 MG TABS Take 1 daily (Patient not taking: Reported on 09/01/2021) 42 tablet 0   ibuprofen (ADVIL) 200 MG tablet Take 400 mg by mouth as needed.     levonorgestrel (MIRENA) 20 MCG/24HR IUD 1 each by Intrauterine route once.     Omega-3 Fatty Acids (FISH OIL) 1000 MG CAPS Take by mouth daily. (Patient not taking: Reported on 10/08/2022)     sertraline (ZOLOFT) 50 MG tablet Take 1 tablet (50 mg total) by mouth daily. (Patient not taking: Reported on 09/01/2021) 30 tablet 3   [DISCONTINUED] medroxyPROGESTERone (DEPO-PROVERA) 150 MG/ML injection Inject 150 mg into the muscle every 3 (three) months.         Review of  Systems  Constitutional:  Negative for appetite change,  chills and fever.  HENT:  Negative for congestion, facial swelling and postnasal drip.   Eyes:  Negative for photophobia.  Respiratory:  Negative for cough and shortness of breath.   Cardiovascular:  Negative for chest pain.  Gastrointestinal:  Positive for abdominal pain. Negative for nausea and vomiting.  Endocrine: Negative for polyuria.  Genitourinary:  Negative for flank pain and pelvic pain.  Musculoskeletal:  Negative for arthralgias.  Skin:  Negative for rash.  Neurological:  Negative for weakness and light-headedness.  Psychiatric/Behavioral:  Negative for confusion.     Pertinent positives and negative per HPI, all others reviewed and negative  Physical Exam   BP (!) 150/90 (BP Location: Right Arm)   Pulse 94   Temp 98.4 F (36.9 C) (Oral)   Resp 20   Ht 5\' 1"  (1.549 m)   Wt 77.8 kg   LMP  (LMP Unknown)   SpO2 98% Comment: room air  BMI 32.42 kg/m   Patient Vitals for the past 24 hrs:  BP Temp Temp src Pulse Resp SpO2 Height Weight  10/11/22 1518 (!) 150/90 98.4 F (36.9 C) Oral 94 20 98 % -- --  10/11/22 1514 -- -- -- -- -- -- 5\' 1"  (1.549 m) 77.8 kg    Physical Exam Vitals reviewed.  Constitutional:      Appearance: Normal appearance.  HENT:     Head: Normocephalic and atraumatic.     Right Ear: External ear normal.     Left Ear: External ear normal.  Cardiovascular:     Rate and Rhythm: Normal rate and regular rhythm.  Pulmonary:     Effort: Pulmonary effort is normal.     Breath sounds: Normal breath sounds.  Abdominal:     General: Abdomen is flat.     Palpations: Abdomen is soft.     Tenderness: There is abdominal tenderness in the right lower quadrant and left lower quadrant.  Skin:    General: Skin is warm.     Capillary Refill: Capillary refill takes less than 2 seconds.  Psychiatric:        Mood and Affect: Mood normal.     Cervical Exam  None  Bedside Ultrasound not  done  Labs Results for orders placed or performed during the hospital encounter of 10/11/22 (from the past 24 hour(s))  CBC with Differential/Platelet     Status: Abnormal   Collection Time: 10/11/22  3:36 PM  Result Value Ref Range   WBC 6.4 4.0 - 10.5 K/uL   RBC 3.83 (L) 3.87 - 5.11 MIL/uL   Hemoglobin 13.2 12.0 - 15.0 g/dL   HCT 10/13/22 10/13/22 - 08.6 %   MCV 95.3 80.0 - 100.0 fL   MCH 34.5 (H) 26.0 - 34.0 pg   MCHC 36.2 (H) 30.0 - 36.0 g/dL   RDW 57.8 46.9 - 62.9 %   Platelets 298 150 - 400 K/uL   nRBC 0.0 0.0 - 0.2 %   Neutrophils Relative % 66 %   Neutro Abs 4.2 1.7 - 7.7 K/uL   Lymphocytes Relative 27 %   Lymphs Abs 1.7 0.7 - 4.0 K/uL   Monocytes Relative 6 %   Monocytes Absolute 0.4 0.1 - 1.0 K/uL   Eosinophils Relative 1 %   Eosinophils Absolute 0.0 0.0 - 0.5 K/uL   Basophils Relative 0 %   Basophils Absolute 0.0 0.0 - 0.1 K/uL   Immature Granulocytes 0 %   Abs Immature Granulocytes 0.01 0.00 - 0.07 K/uL  Comprehensive metabolic panel  Status: Abnormal   Collection Time: 10/11/22  3:36 PM  Result Value Ref Range   Sodium 141 135 - 145 mmol/L   Potassium 3.5 3.5 - 5.1 mmol/L   Chloride 104 98 - 111 mmol/L   CO2 25 22 - 32 mmol/L   Glucose, Bld 101 (H) 70 - 99 mg/dL   BUN 11 6 - 20 mg/dL   Creatinine, Ser 1.610.76 0.44 - 1.00 mg/dL   Calcium 9.3 8.9 - 09.610.3 mg/dL   Total Protein 7.2 6.5 - 8.1 g/dL   Albumin 4.3 3.5 - 5.0 g/dL   AST 18 15 - 41 U/L   ALT 19 0 - 44 U/L   Alkaline Phosphatase 55 38 - 126 U/L   Total Bilirubin 0.4 0.3 - 1.2 mg/dL   GFR, Estimated >04>60 >54>60 mL/min   Anion gap 12 5 - 15  Rh IG workup (includes ABO/Rh)     Status: None (Preliminary result)   Collection Time: 10/11/22  3:36 PM  Result Value Ref Range   Gestational Age(Wks) 0    ABO/RH(D) A NEG    Antibody Screen NEG    Unit Number [id number]    Blood Component Type RHIG    Unit division 00    Status of Unit ISSUED    Transfusion Status      OK TO TRANSFUSE Performed at [facility], [address] [place], [place] [address]     Imaging US OB LESS THAN 14 WEEKS WITH OB TRANSVAGINAL  Result Date: 10/11/2022 CLINICAL DATA:  positive pregnancy with IUD EXAM: OBSTETRIC <14 WK US AND TRANSVAGINAL OB US TECHNIQUE: Both transabdominal and transvaginal ultrasound examinations were performed for complete evaluation of the gestation as well as the maternal uterus, adnexal regions, and pelvic cul-de-sac. Transvaginal technique was performed to assess early pregnancy. COMPARISON:  None Available. FINDINGS: Intrauterine gestational sac: None Yolk sac:  Not Visualized. Embryo:  Not Visualized. Cardiac Activity: Not Visualized. Heart Rate: Not applicable MSD: Not applicable CRL:  Not applicable Subchorionic hemorrhage:  Not applicable Maternal uterus/adnexae: Normal ovaries. There is an IUD present in the uterine fundus. There is a complex mass in the right adnexa measuring 2.5 x 1.9 x 4.0 cm. There is some peripheral vascularity. Trace adjacent free fluid. IMPRESSION: Complex right adnexal mass measuring 2.5 x 1.9 x 4.0 cm, in the setting of a positive pregnancy test is consistent with a tubal ectopic pregnancy. Trace adjacent free fluid, favored to be reactive. No convincing evidence of rupture at this time. IUD is in place in the uterine fundus. These results will be called to the ordering clinician or representative by the Radiologist Assistant, and communication documented in the PACS or [employer] Dashboard. Electronically Signed   By: Caprice RenshawJacob  Kahn M.D.   On: 10/11/2022 13:41    MAU Course  Procedures Lab Orders         CBC with Differential/Platelet         Comprehensive metabolic panel         hCG, quantitative, pregnancy     Meds ordered this encounter  Medications   rho (d) immune globulin (RHIG/RHOPHYLAC) injection 300 mcg   methotrexate (for ectopic pregnancy) 25 mg/mL chemo injection   Imaging Orders  No imaging studies ordered today    MDM moderate  Assessment  and Plan  Right tubal ectopic pregnancy IUD in place, placed 2018  US confirmed today right adnexa measuring 2.5 x 1.9 x 4.0 cm. Hcg 307 on 11/17. CBC, CMP showed  no abnormalities. A neg, started Rh Ig workup, Rhogam given. Started MTX 2 dose treatment since pt has no contraindications. IUD in place and will not be removed for now. F/up appt made for 11/23 for repeat hcg and 2nd methotrexate dose.   Follow-up with primary OB.  Dispo: discharged to home in stable condition.   Discharge Instructions     Discharge patient   Complete by: As directed    Discharge disposition: 01-Home or Self Care   Discharge patient date: 10/11/2022      Allergies as of 10/11/2022       Reactions   Amoxicillin    Childhood reaction        Medication List     STOP taking these medications    ibuprofen 200 MG tablet Commonly known as: ADVIL       TAKE these medications    ALPRAZolam 0.25 MG tablet Commonly known as: XANAX Take 0.25 mg by mouth 3 (three) times daily.   b complex vitamins capsule Take 1 capsule by mouth daily.   BIOTIN PO Take 1 tablet by mouth daily.   Fish Oil 1000 MG Caps Take by mouth daily.   levonorgestrel 20 MCG/24HR IUD Commonly known as: MIRENA 1 each by Intrauterine route once.   Orilissa 150 MG Tabs Generic drug: Elagolix Sodium Take 1 daily   sertraline 50 MG tablet Commonly known as: Zoloft Take 1 tablet (50 mg total) by mouth daily.   TYLENOL PO Take by mouth as needed.   VITAMIN D-3 PO Take by mouth daily.        Myrtie Hawk, DO FMOB Fellow, Faculty practice [facility], Center for [facility] 10/11/22  4:39 PM

## 2022-10-11 NOTE — MAU Note (Signed)
Debbie [name] is a 36 y.o. here in MAU reporting: sent over from the office due to u/s showing mass in one of her tubes with an IUD in place. Was told she could do surgery or methotrexate. Having right sided abdominal pain and light bleeding.   LMP: unknown, no period for 5 years  Onset of complaint: today  Pain score: 7/10  Vitals:   10/11/22 1518  BP: (!) 150/90  Pulse: 94  Resp: 20  Temp: 98.4 F (36.9 C)  SpO2: 98%     Lab orders placed from triage: none

## 2022-10-12 ENCOUNTER — Encounter (HOSPITAL_COMMUNITY): Payer: Self-pay

## 2022-10-12 ENCOUNTER — Other Ambulatory Visit: Payer: Self-pay

## 2022-10-12 ENCOUNTER — Emergency Department (HOSPITAL_COMMUNITY)
Admission: EM | Admit: 2022-10-12 | Discharge: 2022-10-13 | Disposition: A | Discharge status: Home / Self Care | Payer: 59 | Attending: Emergency Medicine | Admitting: Emergency Medicine

## 2022-10-12 ENCOUNTER — Emergency Department (HOSPITAL_COMMUNITY): Payer: 59

## 2022-10-12 DIAGNOSIS — N939 Abnormal uterine and vaginal bleeding, unspecified: Secondary | ICD-10-CM | POA: Diagnosis not present

## 2022-10-12 DIAGNOSIS — O009 Unspecified ectopic pregnancy without intrauterine pregnancy: Secondary | ICD-10-CM | POA: Insufficient documentation

## 2022-10-12 DIAGNOSIS — Z87891 Personal history of nicotine dependence: Secondary | ICD-10-CM | POA: Diagnosis not present

## 2022-10-12 DIAGNOSIS — R102 Pelvic and perineal pain: Secondary | ICD-10-CM | POA: Diagnosis not present

## 2022-10-12 DIAGNOSIS — O00101 Right tubal pregnancy without intrauterine pregnancy: Secondary | ICD-10-CM | POA: Diagnosis not present

## 2022-10-12 LAB — RH IG WORKUP (INCLUDES ABO/RH)
ABO/RH(D): A NEG
Antibody Screen: NEGATIVE
Gestational Age(Wks): 0
Unit division: 0

## 2022-10-12 LAB — CBC
HCT: 32 % — ABNORMAL LOW (ref 36.0–46.0)
Hemoglobin: 11.2 g/dL — ABNORMAL LOW (ref 12.0–15.0)
MCH: 34.3 pg — ABNORMAL HIGH (ref 26.0–34.0)
MCHC: 35 g/dL (ref 30.0–36.0)
MCV: 97.9 fL (ref 80.0–100.0)
Platelets: 245 10*3/uL (ref 150–400)
RBC: 3.27 MIL/uL — ABNORMAL LOW (ref 3.87–5.11)
RDW: 11.7 % (ref 11.5–15.5)
WBC: 8.8 10*3/uL (ref 4.0–10.5)
nRBC: 0 % (ref 0.0–0.2)

## 2022-10-12 MED ORDER — ACETAMINOPHEN 500 MG PO TABS
1000.0000 mg | ORAL_TABLET | Freq: Once | ORAL | Status: AC
  Administered 2022-10-12: 1000 mg via ORAL

## 2022-10-12 NOTE — ED Triage Notes (Addendum)
Pov from home. Cc of ectopic pregnancy with IUD. Went to MAU yesterday given methotrexate.supposed to go back Thursday.  Started bleeding heavily with clots and cramps. Feels like she might faint. Pale during triage. Doesn't think that she could have made it all the way to [facility] to get evaled.

## 2022-10-12 NOTE — ED Provider Notes (Signed)
[facility] Emergency Department Provider Note MRN:  [id number]  Arrival date & time: 10/13/22     Chief Complaint   Ectopic Pregnancy   History of Present Illness   Debbie [name] is a 36 y.o. year-old female with no pertinent past medical history presenting to the ED with chief complaint of ectopic pregnancy.  Patient began having right lower pelvic pain with some vaginal bleeding and passage of clots about 2 weeks ago.  Was recently diagnosed with an ectopic pregnancy.  She has an IUD in place.  She was given her first dose of methotrexate, [facility].  Increase in pain and passage of clots this evening, here for evaluation.  Sometimes feeling lightheaded.  No chest pain or shortness of breath, no fever.  Review of Systems  A thorough review of systems was obtained and all systems are negative except as noted in the HPI and PMH.   Patient's Health History    Past Medical History:  Diagnosis Date   Anxiety    Arthritis    Back pain    Breast mass, right 04/27/2016   Breast tenderness 04/27/2016   Chronic pelvic pain in female    Folliculitis 01/09/2016   Herpes simplex virus (HSV) infection    shingles   Mental disorder    Neuromuscular disorder (HCC)    Shingles     Past Surgical History:  Procedure Laterality Date   APPENDECTOMY     CESAREAN SECTION  2012   [facility]   LAPAROSCOPIC APPENDECTOMY  02/04/2012   Procedure: APPENDECTOMY LAPAROSCOPIC;  Surgeon: [name], MD;  Location[facility];  Service: Gynecology;  Laterality: N/A;   LAPAROSCOPY  02/04/2012   Procedure: LAPAROSCOPY DIAGNOSTIC;  Surgeon: [name], MD;  Location: [facility];  Service: Gynecology;  Laterality: N/A;  diagnostic laparoscopy with incidental appendectomy   WISDOM TOOTH EXTRACTION      Family History  Problem Relation Age of Onset   Diabetes Father    Hypertension Father    Kidney failure Father    Liver disease Father    Cirrhosis  Father    Stomach cancer Paternal Grandfather    Cancer Paternal Grandfather        stomach cancer   Endometriosis Mother    Lupus Mother    Cancer Maternal Grandmother        lung cancer   Cancer Paternal Grandmother        lung cancer   Eczema Son    Hypertension Sister    Lung cancer Other        both grandparents   Colon cancer Neg Hx    Anesthesia problems Neg Hx    Hypotension Neg Hx    Malignant hyperthermia Neg Hx    Pseudochol deficiency Neg Hx     Social History   Socioeconomic History   Marital status: Single    Spouse name: Not on file   Number of children: 1   Years of education: Not on file   Highest education level: Not on file  Occupational History    Employer: NOT EMPLOYED  Tobacco Use   Smoking status: Former    Packs/day: 0.00    Years: 3.00    Total pack years: 0.00    Types: Cigarettes    Quit date: 01/02/2011    Years since quitting: 11.7   Smokeless tobacco: Never  Vaping Use   Vaping Use: Never used  Substance and Sexual  Activity   Alcohol use: No   Drug use: No   Sexual activity: Yes    Birth control/protection: I.U.D.  Other Topics Concern   Not on file  Social History Narrative   Not on file   Social Determinants of Health   Financial Resource Strain: Low Risk  (03/25/2020)   Overall Financial Resource Strain (CARDIA)    Difficulty of Paying Living Expenses: Not very hard  Food Insecurity: No Food Insecurity (03/25/2020)   Hunger Vital Sign    Worried About Running Out of Food in the Last Year: Never true    Ran Out of Food in the Last Year: Never true  Transportation Needs: No Transportation Needs (03/25/2020)   PRAPARE - Administrator, Civil Service (Medical): No    Lack of Transportation (Non-Medical): No  Physical Activity: Sufficiently Active (03/25/2020)   Exercise Vital Sign    Days of Exercise per Week: 4 days    Minutes of Exercise per Session: 60 min  Stress: No Stress Concern Present (03/25/2020)   [employer] of Occupational Health - Occupational Stress Questionnaire    Feeling of Stress : Only a little  Social Connections: Moderately Isolated (03/25/2020)   Social Connection and Isolation Panel [NHANES]    Frequency of Communication with Friends and Family: More than three times a week    Frequency of Social Gatherings with Friends and Family: Twice a week    Attends Religious Services: More than 4 times per year    Active Member of [employer] or Organizations: No    Attends Banker Meetings: Never    Marital Status: Never married  Intimate Partner Violence: Not At Risk (03/25/2020)   Humiliation, Afraid, Rape, and Kick questionnaire    Fear of Current or Ex-Partner: No    Emotionally Abused: No    Physically Abused: No    Sexually Abused: No     Physical Exam   Vitals:   10/12/22 2330 10/13/22 0030  BP: 109/83 116/77  Pulse: 93 76  Resp: 18 17  Temp:    SpO2: 99% 98%    CONSTITUTIONAL: Well-appearing, NAD NEURO/PSYCH:  Alert and oriented x 3, no focal deficits EYES:  eyes equal and reactive ENT/NECK:  no LAD, no JVD CARDIO: Regular rate, well-perfused, normal S1 and S2 PULM:  CTAB no wheezing or rhonchi GI/GU:  non-distended, non-tender MSK/SPINE:  No gross deformities, no edema SKIN:  no rash, atraumatic   *Additional and/or pertinent findings included in MDM below  Diagnostic and Interventional Summary    EKG Interpretation  Date/Time:    Ventricular Rate:    PR Interval:    QRS Duration:   QT Interval:    QTC Calculation:   R Axis:     Text Interpretation:         Labs Reviewed  CBC - Abnormal; Notable for the following components:      Result Value   RBC 3.27 (*)    Hemoglobin 11.2 (*)    HCT 32.0 (*)    MCH 34.3 (*)    All other components within normal limits  COMPREHENSIVE METABOLIC PANEL - Abnormal; Notable for the following components:   Glucose, Bld 137 (*)    Calcium 8.5 (*)    All other components within normal limits  HCG,  QUANTITATIVE, PREGNANCY - Abnormal; Notable for the following components:   hCG, Beta Chain, Quant, S 414 (*)    All other components within normal limits    US  OB LESS THAN 14 WEEKS WITH OB TRANSVAGINAL  Final Result      Medications  acetaminophen (TYLENOL) tablet 1,000 mg (1,000 mg Oral Given 10/12/22 2328)     Procedures  /  Critical Care Procedures  ED Course and Medical Decision Making  Initial Impression and Ddx Reassuring vital signs, overall well-appearing, no significant tenderness on exam.  Favoring expected symptoms in the setting of ectopic pregnancy and methotrexate, however given the increased severity of symptoms, fallopian tube rupture/hemoperitoneum is also considered.  Will check labs, ultrasound.  Past medical/surgical history that increases complexity of ED encounter: History of recent ectopic pregnancy  Interpretation of Diagnostics I personally reviewed the laboratory assessment and my interpretation is as follows: Downtrend in hemoglobin from 13 to 11 over the past day or 2.  Otherwise no significant blood count or electrolyte disturbance  Ultrasound without gross changes, showing evidence of ectopic pregnancy.  Patient Reassessment and Ultimate Disposition/Management     On reassessment patient is resting comfortably, normal vital signs.  No abdominal tenderness, definitely no rebound guarding or rigidity or signs of peritonitis and so highly doubt hemoperitoneum.  Bit of a downtrending hemoglobin is notable however no significant active bleeding at this time.  Patient seems appropriate for her follow-up tomorrow, strict return precautions.  Patient management required discussion with the following services or consulting groups:  None  Complexity of Problems Addressed Acute illness or injury that poses threat of life of bodily function  Additional Data Reviewed and Analyzed Further history obtained from: Further history from spouse/family  member  Additional Factors Impacting ED Encounter Risk Consideration of hospitalization  Elmer Sow. Pilar Plate, MD [facility] Emergency Medicine [facility] [email or web address]  Final Clinical Impressions(s) / ED Diagnoses     ICD-10-CM   1. Ectopic pregnancy, unspecified location, unspecified whether intrauterine pregnancy present  O00.90       ED Discharge Orders     None        Discharge Instructions Discussed with and Provided to Patient:     Discharge Instructions      You were evaluated in the Emergency Department and after careful evaluation, we did not find any emergent condition requiring admission or further testing in the hospital.  Your exam/testing today is overall reassuring.  Recommend keeping your follow-up with OB/GYN to discuss your symptoms and for further management.  Please return to the Emergency Department if you experience any worsening of your condition.   Thank you for allowing [place] to be a part of your care.       Sabas Sous, MD 10/13/22 [phone]

## 2022-10-13 ENCOUNTER — Ambulatory Visit: Payer: 59 | Admitting: Adult Health

## 2022-10-13 LAB — COMPREHENSIVE METABOLIC PANEL
ALT: 17 U/L (ref 0–44)
AST: 17 U/L (ref 15–41)
Albumin: 3.8 g/dL (ref 3.5–5.0)
Alkaline Phosphatase: 44 U/L (ref 38–126)
Anion gap: 7 (ref 5–15)
BUN: 19 mg/dL (ref 6–20)
CO2: 24 mmol/L (ref 22–32)
Calcium: 8.5 mg/dL — ABNORMAL LOW (ref 8.9–10.3)
Chloride: 105 mmol/L (ref 98–111)
Creatinine, Ser: 0.77 mg/dL (ref 0.44–1.00)
GFR, Estimated: >60 mL/min (ref >60)
Glucose, Bld: 137 mg/dL — ABNORMAL HIGH (ref 70–99)
Potassium: 3.8 mmol/L (ref 3.5–5.1)
Sodium: 136 mmol/L (ref 135–145)
Total Bilirubin: 0.4 mg/dL (ref 0.3–1.2)
Total Protein: 6.6 g/dL (ref 6.5–8.1)

## 2022-10-13 LAB — HCG, QUANTITATIVE, PREGNANCY: hCG, Beta Chain, Quant, S: 414 m[IU]/mL — ABNORMAL HIGH (ref <5)

## 2022-10-13 NOTE — Discharge Instructions (Signed)
You were evaluated in the Emergency Department and after careful evaluation, we did not find any emergent condition requiring admission or further testing in the hospital.  Your exam/testing today is overall reassuring.  Recommend keeping your follow-up with OB/GYN to discuss your symptoms and for further management.  Please return to the Emergency Department if you experience any worsening of your condition.   Thank you for allowing [place] to be a part of your care.

## 2022-10-13 NOTE — ED Notes (Signed)
ED Provider at bedside. 

## 2022-10-14 ENCOUNTER — Other Ambulatory Visit (HOSPITAL_COMMUNITY)
Admission: AD | Admit: 2022-10-14 | Discharge: 2022-10-14 | Disposition: A | Discharge status: Home / Self Care | Payer: 59 | Attending: Obstetrics and Gynecology | Admitting: Obstetrics and Gynecology

## 2022-10-14 ENCOUNTER — Inpatient Hospital Stay (HOSPITAL_COMMUNITY)
Admission: AD | Admit: 2022-10-14 | Discharge: 2022-10-14 | Disposition: A | Discharge status: Home / Self Care | Payer: 59 | Attending: Obstetrics and Gynecology | Admitting: Obstetrics and Gynecology

## 2022-10-14 DIAGNOSIS — Z975 Presence of (intrauterine) contraceptive device: Secondary | ICD-10-CM

## 2022-10-14 DIAGNOSIS — O00101 Right tubal pregnancy without intrauterine pregnancy: Secondary | ICD-10-CM | POA: Insufficient documentation

## 2022-10-14 LAB — HCG, QUANTITATIVE, PREGNANCY: hCG, Beta Chain, Quant, S: 395 m[IU]/mL — ABNORMAL HIGH (ref <5)

## 2022-10-14 NOTE — MAU Provider Note (Addendum)
S: 36 y.o. G3P1011 @Unknown  by LMP presents to MAU for repeat hcg on Day 4 following methotrexate treatment for ectopic pregnancy.    She initially had a positive pregnancy test with vaginal bleeding on 09/30/22 with an IUD in place since 2018.  She had office visit at [facility] on 11/17 and outpatient 12/17 on 11/20.  Following her 12/20, which showed a right ectopic pregnancy without evidence of rupture, she was sent to MAU and received methotrexate on 10/11/22.  She had more pain and vaginal bleeding 10/12/22 and was evaluated in the ED at [facility] with labs and ultrasound. Hcg had dropped from 534 on 11/20 to 414 on 11/21 and 12/21 showed right adnexal mass slightly larger but no evidence of ruptured ectopic.  Pt here today for Day 4 follow up after methotraxate treatment.  Her pain is improved today, with mild dull cramping pain and intermittent moderate sharp pains on the right side.  Bleeding is also improved, with scant red bleeding not soaking a pad today.     HPI  O: BP 121/76 (BP Location: Right Arm)   Pulse 84   Temp 98.4 F (36.9 C) (Oral)   Resp 15   Ht 5\' 1"  (1.549 m)   Wt 78.9 kg   LMP  (LMP Unknown)   SpO2 100%   BMI 32.86 kg/m   VS reviewed, nursing note reviewed,  Constitutional: well developed, well nourished, no distress HEENT: normocephalic CV: normal rate Pulm/chest wall: normal effort Abdomen: soft Neuro: alert and oriented x 3 Skin: warm, dry Psych: affect normal  Results for orders placed or performed during the hospital encounter of 10/14/22 (from the past 24 hour(s))  hCG, quantitative, pregnancy     Status: Abnormal   Collection Time: 10/14/22 10:33 AM  Result Value Ref Range   hCG, Beta Chain, Quant, S 395 (H) <5 mIU/mL    --/--/A NEG (11/20 1536)  MDM: Ordered labs/reviewed results.  Quant hcg rose appropriately/inappropriately.  Discussed results with pt.  Pt to return to MAU in 48 hours/follow up with ultrasound in 1 week.  Ectopic  precautions given and pt to return to MAU sooner if s/sx of ectopic, as ruptured ectopic can be life threatening.  Pt stable at time of discharge.  A:  1. Right tubal pregnancy without intrauterine pregnancy   2. IUD (intrauterine device) in place   3. Rh negative---Rhogam given 10/11/22  P: Consult Dr 01-05-1984 pt pt presentation, ED visit from yesterday, and today's hcg. Pt continues to report mild pain, improved from yesterday. D/C home with ectopic/bleeding precautions F/U with Day 7 labs in MAU on 11/28 Return to MAU as needed for emergencies  LEFTWICH-KIRBY, Sanjiv Castorena, CNM 2:18 PM

## 2022-10-14 NOTE — MAU Note (Signed)
...  Debbie [name] is a 36 y.o. at Unknown here in MAU reporting: Here for repeat beta hCG post methotrexate injection that she received on 10/11/2022 here in MAU for ectopic pregnancy. She reports her pain has remained the same on her right side. She reports she experiences a constant cramp as well as an intermittent sharp pain. She reports she is still experiencing vaginal bleeding and reports it takes her around a half of a day to fill a pad from front to back.  Last PO Meds: 650 Tylenol at 0800  Pain scores:  4/10 constant cramp - right pelvis 6/10 sharp cramp - right pelvis - intermittent  Lab orders placed from triage:  hCG quantitative per Raelyn Mora, CNM

## 2022-10-17 ENCOUNTER — Inpatient Hospital Stay (HOSPITAL_COMMUNITY)
Admission: AD | Admit: 2022-10-17 | Discharge: 2022-10-17 | Disposition: A | Discharge status: Home / Self Care | Payer: 59 | Attending: Obstetrics and Gynecology | Admitting: Obstetrics and Gynecology

## 2022-10-17 ENCOUNTER — Inpatient Hospital Stay (HOSPITAL_COMMUNITY): Payer: 59 | Attending: Internal Medicine

## 2022-10-17 ENCOUNTER — Telehealth: Payer: Self-pay | Admitting: Obstetrics and Gynecology

## 2022-10-17 ENCOUNTER — Encounter (HOSPITAL_COMMUNITY): Payer: Self-pay | Admitting: Obstetrics and Gynecology

## 2022-10-17 DIAGNOSIS — O00101 Right tubal pregnancy without intrauterine pregnancy: Secondary | ICD-10-CM | POA: Diagnosis present

## 2022-10-17 DIAGNOSIS — Z975 Presence of (intrauterine) contraceptive device: Secondary | ICD-10-CM | POA: Diagnosis not present

## 2022-10-17 LAB — HCG, QUANTITATIVE, PREGNANCY: hCG, Beta Chain, Quant, S: 188 m[IU]/mL — ABNORMAL HIGH (ref <5)

## 2022-10-17 NOTE — MAU Provider Note (Signed)
History   Chief Complaint:  Follow-up - Day 7 s/p MTX   Debbie [name] is  36 y.o. G3P1011 No LMP recorded (lmp unknown). Patient is pregnant.. Patient is here for follow up of quantitative HCG and ongoing surveillance of pregnancy status. She is Unknown weeks gestation  by LMP.    Since her last visit, the patient is without new complaint. The patient reports bleeding as  none now.  She denies any pain.  General ROS:  negative  Her previous Quantitative HCG values are:  Recent Labs  Lab 10/14/22 1033  HCGBETAQNT 395*     Physical Exam   Blood pressure 120/77, pulse 88, temperature 98.1 F (36.7 C), temperature source Oral, resp. rate 16, height 5\' 1"  (1.549 m), weight 78.5 kg, SpO2 98 %.  Focused Gynecological Exam: examination not indicated  Labs: Results for orders placed or performed during the hospital encounter of 10/17/22 (from the past 24 hour(s))  hCG, quantitative, pregnancy   Collection Time: 10/17/22 10:49 AM  Result Value Ref Range   hCG, Beta Chain, Quant, S 188 (H) <5 mIU/mL    Ultrasound Studies:   10/19/22 OB LESS THAN 14 WEEKS WITH OB TRANSVAGINAL  Result Date: 10/13/2022 CLINICAL DATA:  Pelvic pain EXAM: OBSTETRIC <14 WK 10/15/2022 AND TRANSVAGINAL OB US TECHNIQUE: Both transabdominal and transvaginal ultrasound examinations were performed for complete evaluation of the gestation as well as the maternal uterus, adnexal regions, and pelvic cul-de-sac. Transvaginal technique was performed to assess early pregnancy. COMPARISON:  Obstetrical ultrasound 10/11/2022. FINDINGS: Intrauterine gestational sac: None Endometrium: There is a small amount of fluid in the endometrium. IUD in place in appropriate position, unchanged. The endometrium measures 9 mm in thickness. Right ovary/adnexa: Right ovary within normal limits. Measures 3.9 x 2.5 by 2.5 cm. Heterogeneous mass in the right adnexa measures 4.5 x 2.8 by 4.0 cm (previously 2.5 x 1.9 x 4.0 cm). Left ovary: Left ovary  appears within normal limits measuring 3.7 x 1.7 by 3.0 cm. There is likely corpus luteum in the left ovary. Other: There is a small amount of free fluid in the pelvis. IMPRESSION: 1. No intrauterine gestational sac identified. IUD in place with a small amount of fluid in the endometrium. 2. Right adnexal mass has mildly increased in size. In the setting of a positive pregnancy findings are concerning for right-sided ectopic pregnancy. 3. Small amount of free fluid in the pelvis. Electronically Signed   By: 10/13/2022 M.D.   On: 10/13/2022 00:40   10/15/2022 OB LESS THAN 14 WEEKS WITH OB TRANSVAGINAL  Result Date: 10/11/2022 CLINICAL DATA:  positive pregnancy with IUD EXAM: OBSTETRIC <14 WK 10/13/2022 AND TRANSVAGINAL OB US TECHNIQUE: Both transabdominal and transvaginal ultrasound examinations were performed for complete evaluation of the gestation as well as the maternal uterus, adnexal regions, and pelvic cul-de-sac. Transvaginal technique was performed to assess early pregnancy. COMPARISON:  None Available. FINDINGS: Intrauterine gestational sac: None Yolk sac:  Not Visualized. Embryo:  Not Visualized. Cardiac Activity: Not Visualized. Heart Rate: Not applicable MSD: Not applicable CRL:  Not applicable Subchorionic hemorrhage:  Not applicable Maternal uterus/adnexae: Normal ovaries. There is an IUD present in the uterine fundus. There is a complex mass in the right adnexa measuring 2.5 x 1.9 x 4.0 cm. There is some peripheral vascularity. Trace adjacent free fluid. IMPRESSION: Complex right adnexal mass measuring 2.5 x 1.9 x 4.0 cm, in the setting of a positive pregnancy test is consistent with a tubal ectopic pregnancy. Trace adjacent free fluid,  favored to be reactive. No convincing evidence of rupture at this time. IUD is in place in the uterine fundus. These results will be called to the ordering clinician or representative by the Radiologist Assistant, and communication documented in the PACS or [employer].  Electronically Signed   By: Maurine Simmering M.D.   On: 10/11/2022 13:41    Assessment:   1. Right tubal pregnancy without intrauterine pregnancy   2. IUD (intrauterine device) in place       Plan: -Discharge home in stable condition -Ectopic precautions discussed -Patient advised to follow-up with [facility] in 1 week for weekly HCG blood draws -Patient may return to MAU as needed or if her condition were to change or worsen  Laury Deep, CNM 10/17/2022, 10:50 AM

## 2022-10-17 NOTE — Telephone Encounter (Signed)
Patient notified of HCG level appropriately dropping as expected after MTX. Advised will  now need to have weekly HCG levels in [facility] office. Message sent to [facility] to get patient scheduled for the weekly HCG levels. Patient verbalized an understanding of the plan of care and agrees.   Raelyn Mora, CNM

## 2022-10-17 NOTE — MAU Note (Signed)
.  Debbie [name] is a 36 y.o. at Unknown here in MAU reporting: she is here for follow up lab work. Denies bleeding or pain  Pain score: 0/10 Vitals:   10/17/22 1034  BP: 120/77  Pulse: 88  Resp: 16  Temp: 98.1 F (36.7 C)  SpO2: 98%     Lab orders placed from triage:

## 2022-10-25 ENCOUNTER — Other Ambulatory Visit: Payer: 59

## 2022-10-25 DIAGNOSIS — O00101 Right tubal pregnancy without intrauterine pregnancy: Secondary | ICD-10-CM

## 2022-10-26 LAB — BETA HCG QUANT (REF LAB): hCG Quant: 6 m[IU]/mL

## 2023-05-12 ENCOUNTER — Ambulatory Visit
Admission: EM | Admit: 2023-05-12 | Discharge: 2023-05-12 | Disposition: A | Discharge status: Home / Self Care | Payer: 59 | Attending: Nurse Practitioner | Admitting: Nurse Practitioner

## 2023-05-12 DIAGNOSIS — S161XXA Strain of muscle, fascia and tendon at neck level, initial encounter: Secondary | ICD-10-CM | POA: Diagnosis not present

## 2023-05-12 MED ORDER — IBUPROFEN 800 MG PO TABS: 800.0000 mg | ORAL_TABLET | Freq: Three times a day (TID) | ORAL | 0 refills | Status: DC | PRN

## 2023-05-12 MED ORDER — CYCLOBENZAPRINE HCL 10 MG PO TABS: 10.0000 mg | ORAL_TABLET | Freq: Two times a day (BID) | ORAL | 0 refills | Status: DC | PRN

## 2023-05-12 NOTE — Discharge Instructions (Addendum)
As we discussed, your symptoms are consistent with a muscle strain in your neck.  You can continue the ibuprofen 800 mg every 8 hours as needed along with acetaminophen 500 to 1000 mg every 6 hours as needed for pain.  Recommend heat or ice 15 minutes on, 45 minutes off every hour while awake.  You can take the cyclobenzaprine at nighttime as needed for muscle spasms-be careful when taking this to avoid driving or operating heavy machinery.  Seek care in the emergency room if you develop neck stiffness and are unable to move your neck, fevers, or blurred vision/double vision or worst headache of your life.

## 2023-05-12 NOTE — ED Triage Notes (Signed)
Pt reports she started having left side neck pain at the base of her neck x 2 days. It shoots up to the middle of her head. Used a heating pad. Cannot turn head in either direction without pain. . States its a pulsating pain. Felt whoozy/dizzy, brief wavy vision.

## 2023-05-12 NOTE — ED Provider Notes (Signed)
[facility]    CSN: [id number] Arrival date & time: 05/12/23  1248      History   Chief Complaint No chief complaint on file.   HPI Debbie [name] is a 37 y.o. female.   Patient presents today with 2-day history of left-sided upper neck/lower skull pain.  Debbie [name] reports the pain began shortly after a Teams Meeting at work.  Debbie [name] has never had pain like this before.  Debbie [name] denies any recent fall, trauma, or injury to the neck.  No recent heavy lifting, pushing or pulling of anything out of the ordinary.  Reports Debbie [name] does garden.  Works a Office manager, has a Art therapist with neck support.  Reports currently, pain is a 6 out of 10 without any movement, shoots up to a 8 out of 10 with turning her head back-and-forth.  Reports the pain is pulsatile and sharp/shooting when Debbie [name] moves her head.  Has been using a heating pad, taking ibuprofen, and using her ergonomic chair which does seem to help a little bit.  Reports he woke up and pain was worse today so Debbie [name] came in to be evaluated.  Debbie [name] denies weakness of upper extremities, weakness of neck, new numbness or tingling in upper extremities.  No recent fevers, nausea/vomiting, headache, blurred vision or double vision.  No history of migraines.  Patient denies recent change in medications or supplements.    Past Medical History:  Diagnosis Date   Anxiety    Arthritis    Back pain    Breast mass, right 04/27/2016   Breast tenderness 04/27/2016   Chronic pelvic pain in female    Folliculitis 01/09/2016   Herpes simplex virus (HSV) infection    shingles   Mental disorder    Neuromuscular disorder (HCC)    Shingles     Patient Active Problem List   Diagnosis Date Noted   Follicular cyst of both ovaries 02/04/2021   Endometriosis 02/04/2021   Anxiety and depression 02/04/2021   Encounter for well woman exam with routine gynecological exam 03/25/2020   IUD (intrauterine device) in place 01/29/2019   Encounter for gynecological  examination with Papanicolaou smear of cervix 01/29/2019   Breast mass, right 04/27/2016   Breast tenderness 04/27/2016   Folliculitis 01/09/2016   Knee pain, bilateral 02/14/2012   Chronic pelvic pain in female 02/04/2012   RLQ abdominal pain 01/12/2012   Lumbar radiculopathy 09/29/2011   Low back pain 09/29/2011   Chronic right SI joint pain 09/29/2011    Past Surgical History:  Procedure Laterality Date   APPENDECTOMY     CESAREAN SECTION  2012   [facility]omens   LAPAROSCOPIC APPENDECTOMY  02/04/2012   Procedure: APPENDECTOMY LAPAROSCOPIC;  Surgeon: [name], MD;  Location: [facility];  Service: Gynecology;  Laterality: N/A;   LAPAROSCOPY  02/04/2012   Procedure: LAPAROSCOPY DIAGNOSTIC;  Surgeon: [name], MD;  Location: [facility];  Service: Gynecology;  Laterality: N/A;  diagnostic laparoscopy with incidental appendectomy   WISDOM TOOTH EXTRACTION      OB History     Gravida  3   Para  1   Term  1   Preterm      AB  1   Living  1      SAB      IAB      Ectopic  1   Multiple      Live Births  1            Home  Medications    Prior to Admission medications   Medication Sig Start Date End Date Taking? Authorizing Provider  cyclobenzaprine (FLEXERIL) 10 MG tablet Take 1 tablet (10 mg total) by mouth 2 (two) times daily as needed for muscle spasms. Do not take with alcohol or while driving or operating heavy machinery.  May cause drowsiness. 05/12/23  Yes Cathlean Marseilles A, NP  ibuprofen (ADVIL) 800 MG tablet Take 1 tablet (800 mg total) by mouth every 8 (eight) hours as needed. Take with food to prevent GI upset 05/12/23  Yes Cathlean Marseilles A, NP  Acetaminophen (TYLENOL PO) Take by mouth as needed. Patient not taking: Reported on 10/08/2022    [provider]  ALPRAZolam Prudy Feeler) 0.25 MG tablet Take 0.25 mg by mouth 3 (three) times daily. 06/14/22   [provider]  b complex vitamins capsule Take 1 capsule by mouth daily.    [provider]  BIOTIN PO Take 1 tablet by mouth daily.    [provider]  Cholecalciferol (VITAMIN D-3 PO) Take by mouth daily.    [provider]  Elagolix Sodium (ORILISSA) 150 MG TABS Take 1 daily Patient not taking: Reported on 09/01/2021 02/04/21   Adline Potter, NP  levonorgestrel (MIRENA) 20 MCG/24HR IUD 1 each by Intrauterine route once.    [provider]  Omega-3 Fatty Acids (FISH OIL) 1000 MG CAPS Take by mouth daily. Patient not taking: Reported on 10/08/2022    [provider]  medroxyPROGESTERone (DEPO-PROVERA) 150 MG/ML injection Inject 150 mg into the muscle every 3 (three) months.    01/28/12  [provider]    Family History Family History  Problem Relation Age of Onset   Diabetes Father    Hypertension Father    Kidney failure Father    Liver disease Father    Cirrhosis Father    Stomach cancer Paternal Grandfather    Cancer Paternal Grandfather        stomach cancer   Endometriosis Mother    Lupus Mother    Cancer Maternal Grandmother        lung cancer   Cancer Paternal Grandmother        lung cancer   Eczema Son    Hypertension Sister    Lung cancer Other        both grandparents   Colon cancer Neg Hx    Anesthesia problems Neg Hx    Hypotension Neg Hx    Malignant hyperthermia Neg Hx    Pseudochol deficiency Neg Hx     Social History Social History   Tobacco Use   Smoking status: Former    Packs/day: 0.00    Years: 3.00    Additional pack years: 0.00    Total pack years: 0.00    Types: Cigarettes    Quit date: 01/02/2011    Years since quitting: 12.3   Smokeless tobacco: Never  Vaping Use   Vaping Use: Never used  Substance Use Topics   Alcohol use: No   Drug use: No     Allergies   Amoxicillin   Review of Systems Review of Systems Per HPI  Physical Exam Triage Vital Signs ED Triage Vitals  Enc Vitals Group     BP 05/12/23 1255 (!) 140/82     Pulse Rate 05/12/23 1255 95      Resp 05/12/23 1255 18     Temp 05/12/23 1255 98 F (36.7 C)     Temp Source 05/12/23 1255 Oral  SpO2 05/12/23 1255 98 %     Weight --      Height --      Head Circumference --      Peak Flow --      Pain Score 05/12/23 1258 6     Pain Loc --      Pain Edu? --      Excl. in GC? --    No data found.  Updated Vital Signs BP (!) 140/82 (BP Location: Right Arm)   Pulse 95   Temp 98 F (36.7 C) (Oral)   Resp 18   LMP  (LMP Unknown) Comment: pt does not get MP  SpO2 98%   Breastfeeding Unknown   Visual Acuity Right Eye Distance:   Left Eye Distance:   Bilateral Distance:    Right Eye Near:   Left Eye Near:    Bilateral Near:     Physical Exam Vitals and nursing note reviewed.  Constitutional:      General: Debbie [name] is not in acute distress.    Appearance: Normal appearance. Debbie [name] is not toxic-appearing.  HENT:     Mouth/Throat:     Mouth: Mucous membranes are moist.     Pharynx: Oropharynx is clear.  Neck:      Comments: Inspection: No swelling, obvious deformity, redness, or bruising to left skull Palpation: Left skull tender to palpation in approximately area marked; no obvious deformities palpated ROM: Full ROM to neck, bilateral upper extremities Strength: 5/5 bilateral upper extremities Neurovascular: neurovascularly intact in left and right upper extremity  Pulmonary:     Effort: Pulmonary effort is normal. No respiratory distress.  Skin:    General: Skin is warm and dry.     Capillary Refill: Capillary refill takes less than 2 seconds.     Coloration: Skin is not jaundiced or pale.     Findings: No erythema.  Neurological:     Mental Status: Debbie [name] is alert and oriented to person, place, and time.  Psychiatric:        Behavior: Behavior is cooperative.      UC Treatments / Results  Labs (all labs ordered are listed, but only abnormal results are displayed) Labs Reviewed - No data to display  EKG   Radiology No results  found.  Procedures Procedures (including critical care time)  Medications Ordered in UC Medications - No data to display  Initial Impression / Assessment and Plan / UC Course  I have reviewed the triage vital signs and the nursing notes.  Pertinent labs & imaging results that were available during my care of the patient were reviewed by me and considered in my medical decision making (see chart for details).   Patient is well-appearing, normotensive, afebrile, not tachycardic, not tachypneic, oxygenating well on room air.    1. Strain of neck muscle, initial encounter Symptoms and exam findings are consistent with a muscle strain No red flags in history or on exam today Treat with ongoing NSAIDs, Tylenol alternating with ibuprofen, heat/ice, and muscle relaxant Start light range of motion/stretching exercises Strict ER precautions discussed with patient Note given for work    The patient was given the opportunity to ask questions.  All questions answered to their satisfaction.  The patient is in agreement to this plan.    Final Clinical Impressions(s) / UC Diagnoses   Final diagnoses:  Strain of neck muscle, initial encounter     Discharge Instructions      As we discussed, your symptoms are consistent  with a muscle strain in your neck.  You can continue the ibuprofen 800 mg every 8 hours as needed along with acetaminophen 500 to 1000 mg every 6 hours as needed for pain.  Recommend heat or ice 15 minutes on, 45 minutes off every hour while awake.  You can take the cyclobenzaprine at nighttime as needed for muscle spasms-be careful when taking this to avoid driving or operating heavy machinery.  Seek care in the emergency room if you develop neck stiffness and are unable to move your neck, fevers, or blurred vision/double vision or worst headache of your life.     ED Prescriptions     Medication Sig Dispense Auth. Provider   cyclobenzaprine (FLEXERIL) 10 MG tablet Take 1  tablet (10 mg total) by mouth 2 (two) times daily as needed for muscle spasms. Do not take with alcohol or while driving or operating heavy machinery.  May cause drowsiness. 20 tablet Cathlean Marseilles A, NP   ibuprofen (ADVIL) 800 MG tablet Take 1 tablet (800 mg total) by mouth every 8 (eight) hours as needed. Take with food to prevent GI upset 21 tablet Valentino Nose, NP      PDMP not reviewed this encounter.   Valentino Nose, NP 05/12/23 [phone]

## 2024-02-14 ENCOUNTER — Encounter: Payer: Self-pay | Admitting: Adult Health

## 2024-02-14 ENCOUNTER — Ambulatory Visit: Payer: 59 | Admitting: Adult Health

## 2024-02-14 ENCOUNTER — Other Ambulatory Visit (HOSPITAL_COMMUNITY)
Admission: RE | Admit: 2024-02-14 | Discharge: 2024-02-14 | Disposition: A | Discharge status: Home / Self Care | Source: Ambulatory Visit | Attending: Adult Health | Admitting: Adult Health

## 2024-02-14 VITALS — BP 125/84 | HR 76 | Ht 62.0 in | Wt 180.5 lb

## 2024-02-14 DIAGNOSIS — Z975 Presence of (intrauterine) contraceptive device: Secondary | ICD-10-CM

## 2024-02-14 DIAGNOSIS — Z01419 Encounter for gynecological examination (general) (routine) without abnormal findings: Secondary | ICD-10-CM

## 2024-02-14 NOTE — Progress Notes (Signed)
 Patient ID: Debbie [name], female   DOB: [birth year], 38 y.o.   MRN: [id number] History of Present Illness: Debbie [name] is a 38 year old white female, with SO, G3P1021 in for a well woman gyn exam and pap.  PCP is Dr Sherwood Gambler   Current Medications, Allergies, Past Medical History, Past Surgical History, Family History and Social History were reviewed in [employer] electronic medical record.     Review of Systems: Patient denies any headaches, hearing loss, fatigue, blurred vision, shortness of breath, chest pain, abdominal pain, problems with bowel movements, urination, or intercourse. No joint pain or mood swings.  May think about having another baby, has hx of 2 ectopics, may want to get HSG to check tubes  Has IUD no period, may spot occasionally   Physical Exam:BP 125/84 (BP Location: Left Arm, Patient Position: Sitting, Cuff Size: Normal)   Pulse 76   Ht 5\' 2"  (1.575 m)   Wt 180 lb 8 oz (81.9 kg)   Breastfeeding No   BMI 33.01 kg/m   General:  Well developed, well nourished, no acute distress Skin:  Warm and dry Neck:  Midline trachea, normal thyroid, good ROM, no lymphadenopathy Lungs; Clear to auscultation bilaterally Breast:  No dominant palpable mass, retraction, or nipple discharge Cardiovascular: Regular rate and rhythm Abdomen:  Soft, non tender, no hepatosplenomegaly Pelvic:  External genitalia is normal in appearance, no lesions.  The vagina is normal in appearance. Urethra has no lesions or masses. The cervix is bulbous, +IUD strings at os, pap with HR HPV genotyping performed by Marylynn Pearson NP student under my supervision.  Uterus is felt to be normal size, shape, and contour.  No adnexal masses or tenderness noted.Bladder is non tender, no masses felt. Extremities/musculoskeletal:  No swelling or varicosities noted, no clubbing or cyanosis Psych:  No mood changes, alert and cooperative,seems happy AA is 1 Fall risk is low    02/14/2024    2:26 PM 02/04/2021     9:46 AM 03/25/2020    3:31 PM  Depression screen PHQ 2/9  Decreased Interest 0 3 1  Down, Depressed, Hopeless 0 3 1  PHQ - 2 Score 0 6 2  Altered sleeping 1 2 0  Tired, decreased energy 1 2 1   Change in appetite 1 3 0  Feeling bad or failure about yourself  0 1 0  Trouble concentrating 1 3 0  Moving slowly or fidgety/restless 0 0 0  Suicidal thoughts 0 0 0  PHQ-9 Score 4 17 3        02/14/2024    2:27 PM 03/25/2020    3:31 PM  GAD 7 : Generalized Anxiety Score  Nervous, Anxious, on Edge 2 1  Control/stop worrying 0 1  Worry too much - different things 1 1  Trouble relaxing 2 1  Restless 0 0  Easily annoyed or irritable 0 1  Afraid - awful might happen 0 0  Total GAD 7 Score 5 5      Upstream - 02/14/24 1436       Pregnancy Intention Screening   Does the patient want to become pregnant in the next year? Unsure    Does the patient's partner want to become pregnant in the next year? Unsure    Would the patient like to discuss contraceptive options today? No      Contraception Wrap Up   Current Method IUD or IUS    End Method IUD or IUS    Contraception Counseling Provided Yes  CO exam with Marylynn Pearson NP student   Impression and plan: 1. Encounter for gynecological examination with Papanicolaou smear of cervix (Primary) Pap sent Pap in 3 years if normal Physical in 1 year  - Cytology - PAP( [place])  2. IUD (intrauterine device) in place Mirena placed 08/11/17

## 2024-02-20 LAB — CYTOLOGY - PAP
Comment: NEGATIVE
Comment: NEGATIVE
Comment: NEGATIVE
Diagnosis: NEGATIVE
HPV 16: NEGATIVE
HPV 18 / 45: NEGATIVE
High risk HPV: POSITIVE — AB

## 2024-02-21 ENCOUNTER — Other Ambulatory Visit: Payer: Self-pay | Admitting: Adult Health

## 2024-02-21 ENCOUNTER — Encounter: Payer: Self-pay | Admitting: Adult Health

## 2024-02-21 DIAGNOSIS — R87810 Cervical high risk human papillomavirus (HPV) DNA test positive: Secondary | ICD-10-CM | POA: Insufficient documentation

## 2024-02-21 MED ORDER — METRONIDAZOLE 500 MG PO TABS: 500.0000 mg | ORAL_TABLET | Freq: Two times a day (BID) | ORAL | 0 refills | Status: AC
